# Patient Record
Sex: Female | Born: 1965 | Race: White | Hispanic: No | Marital: Single | Smoking: Former smoker
Health system: Southern US, Community
[De-identification: ages and names within clinical notes are randomized; demographics above are authoritative.]

## PROBLEM LIST (undated history)

## (undated) ENCOUNTER — Emergency Department (HOSPITAL_COMMUNITY): Admission: EM | Payer: Medicaid Other | Source: Home / Self Care

## (undated) DIAGNOSIS — F32A Depression, unspecified: Secondary | ICD-10-CM

## (undated) DIAGNOSIS — F329 Major depressive disorder, single episode, unspecified: Secondary | ICD-10-CM

## (undated) DIAGNOSIS — K760 Fatty (change of) liver, not elsewhere classified: Secondary | ICD-10-CM

## (undated) DIAGNOSIS — F250 Schizoaffective disorder, bipolar type: Secondary | ICD-10-CM

## (undated) DIAGNOSIS — R569 Unspecified convulsions: Secondary | ICD-10-CM

## (undated) HISTORY — DX: Unspecified convulsions: R56.9

## (undated) HISTORY — DX: Fatty (change of) liver, not elsewhere classified: K76.0

## (undated) HISTORY — PX: TUBAL LIGATION: SHX77

## (undated) HISTORY — DX: Major depressive disorder, single episode, unspecified: F32.9

## (undated) HISTORY — DX: Depression, unspecified: F32.A

---

## 1998-04-28 ENCOUNTER — Inpatient Hospital Stay (HOSPITAL_COMMUNITY): Admission: AD | Admit: 1998-04-28 | Discharge: 1998-05-04 | Payer: Self-pay | Admitting: Psychiatry

## 2000-07-16 ENCOUNTER — Inpatient Hospital Stay (HOSPITAL_COMMUNITY): Admission: EM | Admit: 2000-07-16 | Discharge: 2000-07-19 | Payer: Self-pay | Admitting: *Deleted

## 2001-08-16 ENCOUNTER — Other Ambulatory Visit: Admission: RE | Admit: 2001-08-16 | Discharge: 2001-08-16 | Payer: Self-pay | Admitting: Obstetrics and Gynecology

## 2002-08-28 ENCOUNTER — Encounter: Payer: Self-pay | Admitting: Obstetrics and Gynecology

## 2002-08-28 ENCOUNTER — Ambulatory Visit (HOSPITAL_COMMUNITY): Admission: RE | Admit: 2002-08-28 | Discharge: 2002-08-28 | Payer: Self-pay | Admitting: Obstetrics and Gynecology

## 2004-05-30 ENCOUNTER — Emergency Department (HOSPITAL_COMMUNITY): Admission: EM | Admit: 2004-05-30 | Discharge: 2004-05-30 | Payer: Self-pay | Admitting: Emergency Medicine

## 2004-08-06 ENCOUNTER — Emergency Department (HOSPITAL_COMMUNITY): Admission: EM | Admit: 2004-08-06 | Discharge: 2004-08-06 | Payer: Self-pay | Admitting: *Deleted

## 2004-08-12 ENCOUNTER — Emergency Department (HOSPITAL_COMMUNITY): Admission: EM | Admit: 2004-08-12 | Discharge: 2004-08-12 | Payer: Self-pay | Admitting: Emergency Medicine

## 2004-09-06 ENCOUNTER — Emergency Department (HOSPITAL_COMMUNITY): Admission: EM | Admit: 2004-09-06 | Discharge: 2004-09-06 | Payer: Self-pay | Admitting: *Deleted

## 2004-09-09 ENCOUNTER — Emergency Department (HOSPITAL_COMMUNITY): Admission: EM | Admit: 2004-09-09 | Discharge: 2004-09-09 | Payer: Self-pay | Admitting: Emergency Medicine

## 2004-10-05 ENCOUNTER — Emergency Department (HOSPITAL_COMMUNITY): Admission: EM | Admit: 2004-10-05 | Discharge: 2004-10-05 | Payer: Self-pay | Admitting: Emergency Medicine

## 2005-02-22 ENCOUNTER — Emergency Department (HOSPITAL_COMMUNITY): Admission: EM | Admit: 2005-02-22 | Discharge: 2005-02-23 | Payer: Self-pay | Admitting: Emergency Medicine

## 2005-03-21 ENCOUNTER — Emergency Department (HOSPITAL_COMMUNITY): Admission: EM | Admit: 2005-03-21 | Discharge: 2005-03-21 | Payer: Self-pay | Admitting: Emergency Medicine

## 2005-03-28 ENCOUNTER — Emergency Department (HOSPITAL_COMMUNITY): Admission: EM | Admit: 2005-03-28 | Discharge: 2005-03-29 | Payer: Self-pay | Admitting: Emergency Medicine

## 2005-04-06 ENCOUNTER — Ambulatory Visit (HOSPITAL_COMMUNITY): Admission: RE | Admit: 2005-04-06 | Discharge: 2005-04-06 | Payer: Self-pay | Admitting: General Surgery

## 2005-04-06 ENCOUNTER — Encounter (INDEPENDENT_AMBULATORY_CARE_PROVIDER_SITE_OTHER): Payer: Self-pay | Admitting: General Surgery

## 2005-05-10 ENCOUNTER — Emergency Department (HOSPITAL_COMMUNITY): Admission: EM | Admit: 2005-05-10 | Discharge: 2005-05-10 | Payer: Self-pay | Admitting: Emergency Medicine

## 2005-05-13 ENCOUNTER — Emergency Department (HOSPITAL_COMMUNITY): Admission: EM | Admit: 2005-05-13 | Discharge: 2005-05-13 | Payer: Self-pay | Admitting: Emergency Medicine

## 2005-07-27 ENCOUNTER — Emergency Department (HOSPITAL_COMMUNITY): Admission: EM | Admit: 2005-07-27 | Discharge: 2005-07-27 | Payer: Self-pay | Admitting: Emergency Medicine

## 2005-08-02 ENCOUNTER — Ambulatory Visit (HOSPITAL_COMMUNITY): Admission: RE | Admit: 2005-08-02 | Discharge: 2005-08-02 | Payer: Self-pay | Admitting: Emergency Medicine

## 2005-08-02 ENCOUNTER — Emergency Department (HOSPITAL_COMMUNITY): Admission: EM | Admit: 2005-08-02 | Discharge: 2005-08-02 | Payer: Self-pay | Admitting: Emergency Medicine

## 2005-09-29 ENCOUNTER — Emergency Department (HOSPITAL_COMMUNITY): Admission: EM | Admit: 2005-09-29 | Discharge: 2005-09-29 | Payer: Self-pay | Admitting: Emergency Medicine

## 2005-09-29 ENCOUNTER — Ambulatory Visit: Payer: Self-pay | Admitting: Internal Medicine

## 2005-09-29 ENCOUNTER — Ambulatory Visit (HOSPITAL_COMMUNITY): Admission: RE | Admit: 2005-09-29 | Discharge: 2005-09-29 | Payer: Self-pay | Admitting: Internal Medicine

## 2005-10-06 ENCOUNTER — Ambulatory Visit (HOSPITAL_COMMUNITY): Admission: RE | Admit: 2005-10-06 | Discharge: 2005-10-06 | Payer: Self-pay | Admitting: Internal Medicine

## 2005-10-06 ENCOUNTER — Ambulatory Visit: Payer: Self-pay | Admitting: Internal Medicine

## 2005-10-06 ENCOUNTER — Encounter (INDEPENDENT_AMBULATORY_CARE_PROVIDER_SITE_OTHER): Payer: Self-pay | Admitting: Specialist

## 2005-10-13 ENCOUNTER — Ambulatory Visit: Payer: Self-pay | Admitting: Internal Medicine

## 2005-10-13 ENCOUNTER — Emergency Department (HOSPITAL_COMMUNITY): Admission: EM | Admit: 2005-10-13 | Discharge: 2005-10-13 | Payer: Self-pay | Admitting: Emergency Medicine

## 2005-10-18 ENCOUNTER — Emergency Department (HOSPITAL_COMMUNITY): Admission: EM | Admit: 2005-10-18 | Discharge: 2005-10-18 | Payer: Self-pay | Admitting: Emergency Medicine

## 2005-10-20 ENCOUNTER — Emergency Department (HOSPITAL_COMMUNITY): Admission: EM | Admit: 2005-10-20 | Discharge: 2005-10-20 | Payer: Self-pay | Admitting: Emergency Medicine

## 2005-11-16 ENCOUNTER — Emergency Department (HOSPITAL_COMMUNITY): Admission: EM | Admit: 2005-11-16 | Discharge: 2005-11-17 | Payer: Self-pay | Admitting: Emergency Medicine

## 2005-11-20 ENCOUNTER — Emergency Department (HOSPITAL_COMMUNITY): Admission: EM | Admit: 2005-11-20 | Discharge: 2005-11-21 | Payer: Self-pay | Admitting: Emergency Medicine

## 2005-11-25 ENCOUNTER — Ambulatory Visit: Payer: Self-pay | Admitting: Internal Medicine

## 2005-12-06 ENCOUNTER — Emergency Department (HOSPITAL_COMMUNITY): Admission: EM | Admit: 2005-12-06 | Discharge: 2005-12-06 | Payer: Self-pay | Admitting: Emergency Medicine

## 2005-12-08 ENCOUNTER — Emergency Department (HOSPITAL_COMMUNITY): Admission: EM | Admit: 2005-12-08 | Discharge: 2005-12-08 | Payer: Self-pay | Admitting: Emergency Medicine

## 2005-12-13 ENCOUNTER — Observation Stay (HOSPITAL_COMMUNITY): Admission: EM | Admit: 2005-12-13 | Discharge: 2005-12-14 | Payer: Self-pay | Admitting: Emergency Medicine

## 2005-12-13 ENCOUNTER — Emergency Department (HOSPITAL_COMMUNITY): Admission: EM | Admit: 2005-12-13 | Discharge: 2005-12-13 | Payer: Self-pay | Admitting: Emergency Medicine

## 2005-12-18 ENCOUNTER — Emergency Department (HOSPITAL_COMMUNITY): Admission: EM | Admit: 2005-12-18 | Discharge: 2005-12-18 | Payer: Self-pay | Admitting: Emergency Medicine

## 2005-12-31 ENCOUNTER — Emergency Department (HOSPITAL_COMMUNITY): Admission: EM | Admit: 2005-12-31 | Discharge: 2006-01-01 | Payer: Self-pay | Admitting: Emergency Medicine

## 2006-01-10 ENCOUNTER — Emergency Department (HOSPITAL_COMMUNITY): Admission: EM | Admit: 2006-01-10 | Discharge: 2006-01-10 | Payer: Self-pay | Admitting: Emergency Medicine

## 2006-01-12 ENCOUNTER — Emergency Department (HOSPITAL_COMMUNITY): Admission: EM | Admit: 2006-01-12 | Discharge: 2006-01-12 | Payer: Self-pay | Admitting: Emergency Medicine

## 2006-01-13 ENCOUNTER — Inpatient Hospital Stay: Payer: Self-pay | Admitting: Psychiatry

## 2006-02-08 ENCOUNTER — Emergency Department (HOSPITAL_COMMUNITY): Admission: EM | Admit: 2006-02-08 | Discharge: 2006-02-08 | Payer: Self-pay | Admitting: Emergency Medicine

## 2006-09-22 ENCOUNTER — Ambulatory Visit: Payer: Self-pay | Admitting: Internal Medicine

## 2006-10-05 ENCOUNTER — Ambulatory Visit: Payer: Self-pay | Admitting: Internal Medicine

## 2007-03-19 ENCOUNTER — Other Ambulatory Visit: Admission: RE | Admit: 2007-03-19 | Discharge: 2007-03-19 | Payer: Self-pay | Admitting: Obstetrics and Gynecology

## 2007-03-23 ENCOUNTER — Ambulatory Visit: Payer: Self-pay | Admitting: Gastroenterology

## 2007-03-26 ENCOUNTER — Ambulatory Visit (HOSPITAL_COMMUNITY): Admission: RE | Admit: 2007-03-26 | Discharge: 2007-03-26 | Payer: Self-pay | Admitting: Obstetrics and Gynecology

## 2007-06-07 ENCOUNTER — Emergency Department (HOSPITAL_COMMUNITY): Admission: EM | Admit: 2007-06-07 | Discharge: 2007-06-07 | Payer: Self-pay | Admitting: Emergency Medicine

## 2007-06-07 ENCOUNTER — Ambulatory Visit (HOSPITAL_COMMUNITY): Admission: RE | Admit: 2007-06-07 | Discharge: 2007-06-07 | Payer: Self-pay | Admitting: Gastroenterology

## 2007-07-16 ENCOUNTER — Ambulatory Visit: Payer: Self-pay | Admitting: Internal Medicine

## 2007-08-24 IMAGING — CR DG ANKLE COMPLETE 3+V*R*
3 series · 3 of 3 positions shown · non-contrast
Comparison: none

CLINICAL DATA: Patient slipped and fell leaving hospital with pain and swelling. 
 RIGHT ANKLE - 3 VIEW:

[view not recorded (1 of 3)]
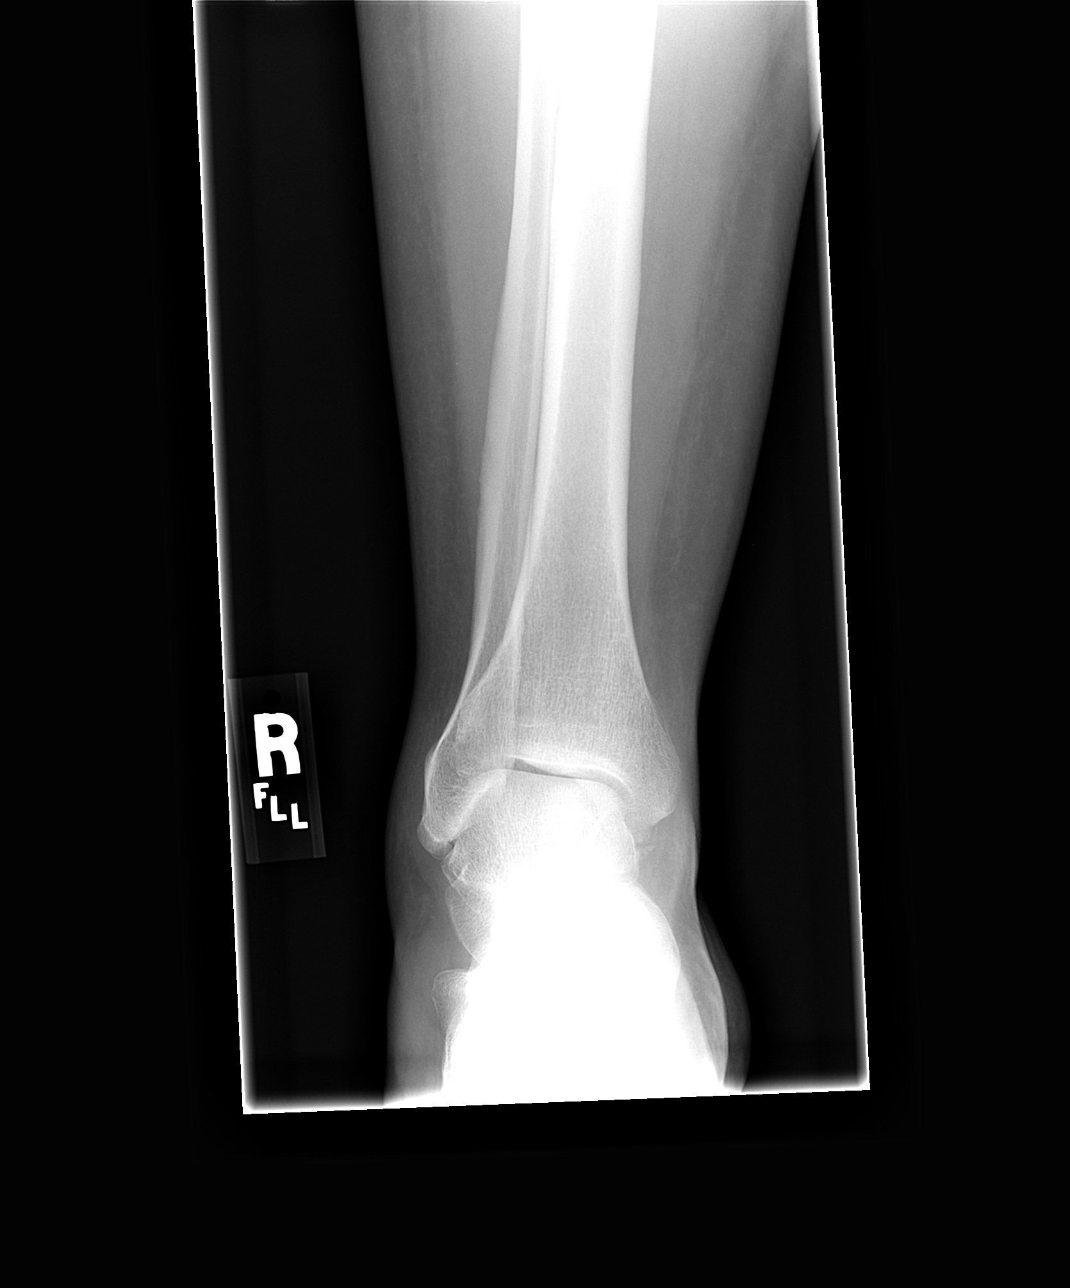

[view not recorded (2 of 3)]
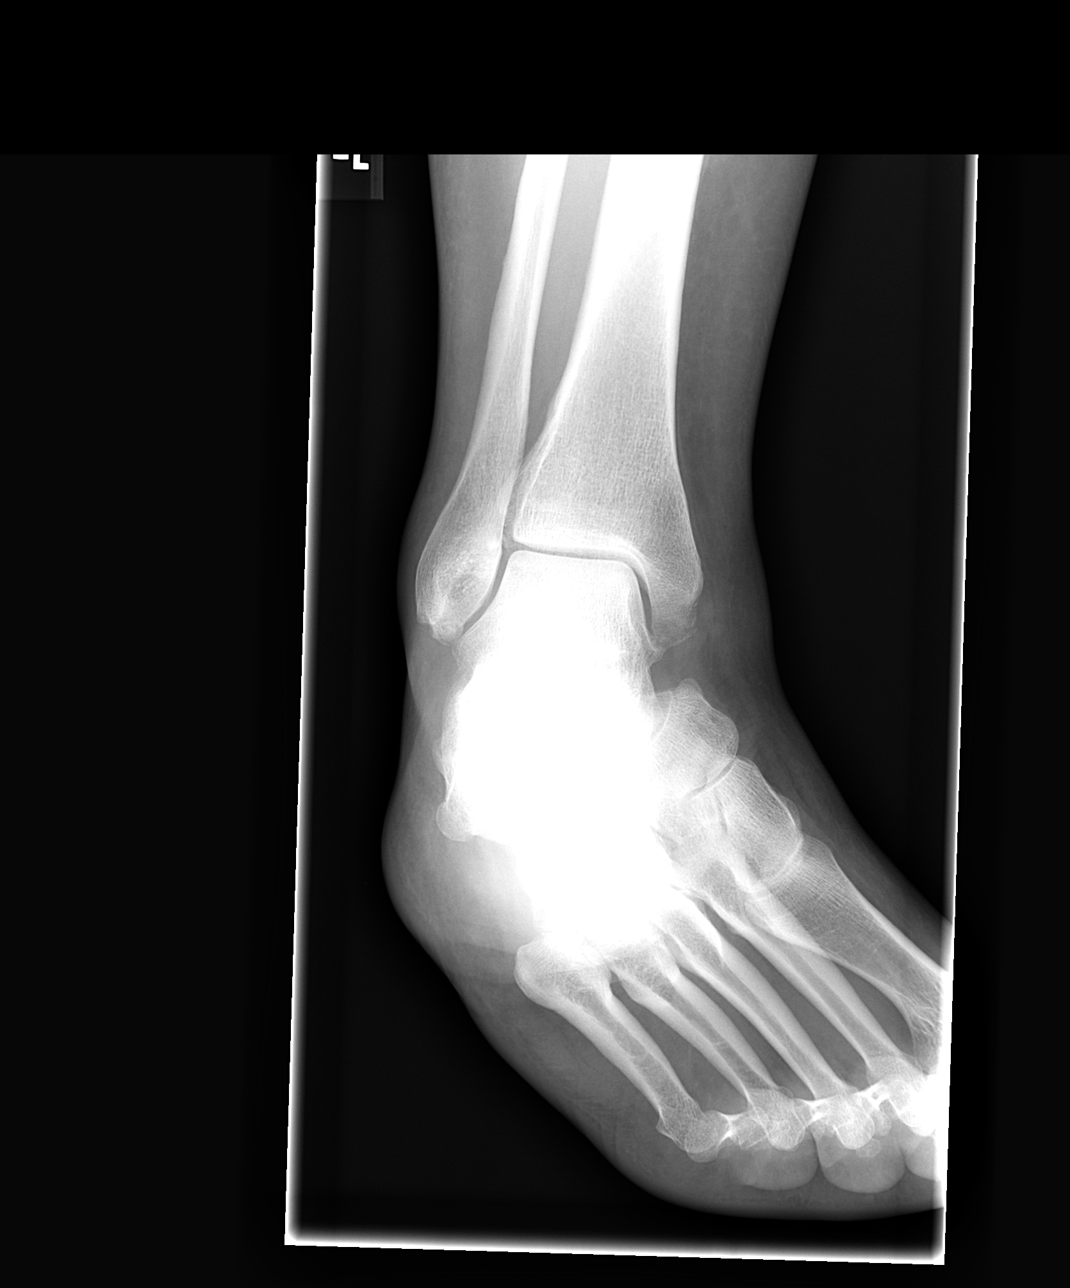

[view not recorded (3 of 3)]
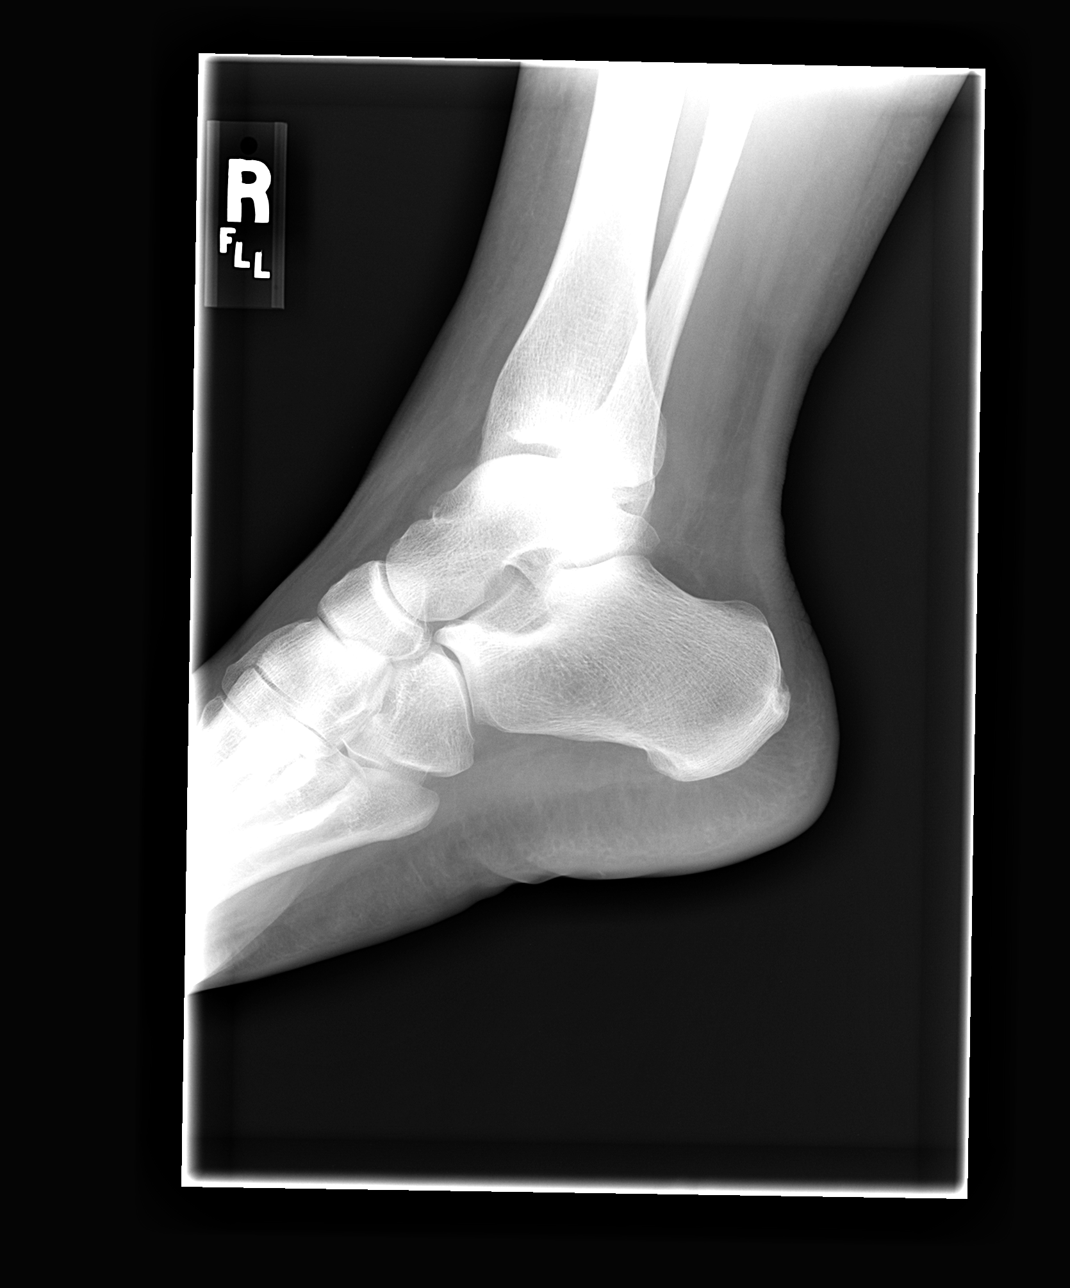

[3 of 3 positions shown; findings below may reference images not displayed]

FINDINGS: Three views of the right ankle were obtained.  There are small avulsion fragments from the tip of the medial malleolus with adjacent soft tissue swelling.  No other acute bony abnormality is seen.
IMPRESSION: Small avulsion fragments from tip of medial malleolus. 
 RIGHT FOOT - 3 VIEW:
 Three views of the right foot show no acute abnormality.  Alignment is normal.
IMPRESSION: Negative right foot.

## 2007-12-05 ENCOUNTER — Encounter: Payer: Self-pay | Admitting: Orthopedic Surgery

## 2007-12-05 ENCOUNTER — Ambulatory Visit (HOSPITAL_COMMUNITY): Admission: RE | Admit: 2007-12-05 | Discharge: 2007-12-05 | Payer: Self-pay | Admitting: Internal Medicine

## 2007-12-06 ENCOUNTER — Ambulatory Visit: Payer: Self-pay | Admitting: Internal Medicine

## 2007-12-12 ENCOUNTER — Telehealth: Payer: Self-pay | Admitting: Orthopedic Surgery

## 2007-12-12 ENCOUNTER — Ambulatory Visit: Payer: Self-pay | Admitting: Orthopedic Surgery

## 2007-12-12 DIAGNOSIS — M25529 Pain in unspecified elbow: Secondary | ICD-10-CM | POA: Insufficient documentation

## 2007-12-12 DIAGNOSIS — M7710 Lateral epicondylitis, unspecified elbow: Secondary | ICD-10-CM | POA: Insufficient documentation

## 2007-12-12 DIAGNOSIS — G5600 Carpal tunnel syndrome, unspecified upper limb: Secondary | ICD-10-CM | POA: Insufficient documentation

## 2007-12-14 ENCOUNTER — Encounter: Payer: Self-pay | Admitting: Orthopedic Surgery

## 2007-12-19 ENCOUNTER — Encounter: Payer: Self-pay | Admitting: Orthopedic Surgery

## 2008-02-11 ENCOUNTER — Ambulatory Visit: Payer: Self-pay | Admitting: Orthopedic Surgery

## 2008-03-10 ENCOUNTER — Ambulatory Visit: Payer: Self-pay | Admitting: Orthopedic Surgery

## 2008-03-10 DIAGNOSIS — M79609 Pain in unspecified limb: Secondary | ICD-10-CM | POA: Insufficient documentation

## 2008-10-31 ENCOUNTER — Other Ambulatory Visit: Admission: RE | Admit: 2008-10-31 | Discharge: 2008-10-31 | Payer: Self-pay | Admitting: Obstetrics & Gynecology

## 2009-02-23 ENCOUNTER — Encounter: Payer: Self-pay | Admitting: Gastroenterology

## 2009-04-08 ENCOUNTER — Encounter: Payer: Self-pay | Admitting: Gastroenterology

## 2009-06-22 ENCOUNTER — Encounter: Payer: Self-pay | Admitting: Urgent Care

## 2009-07-09 ENCOUNTER — Emergency Department (HOSPITAL_COMMUNITY): Admission: EM | Admit: 2009-07-09 | Discharge: 2009-07-09 | Payer: Self-pay | Admitting: Emergency Medicine

## 2009-07-11 ENCOUNTER — Inpatient Hospital Stay: Payer: Self-pay | Admitting: Psychiatry

## 2009-11-18 ENCOUNTER — Encounter: Payer: Self-pay | Admitting: Urgent Care

## 2009-12-21 ENCOUNTER — Encounter: Payer: Self-pay | Admitting: Urgent Care

## 2009-12-22 ENCOUNTER — Encounter: Payer: Self-pay | Admitting: Urgent Care

## 2010-01-14 ENCOUNTER — Other Ambulatory Visit: Admission: RE | Admit: 2010-01-14 | Discharge: 2010-01-14 | Payer: Self-pay | Admitting: Obstetrics & Gynecology

## 2010-01-18 ENCOUNTER — Ambulatory Visit (HOSPITAL_COMMUNITY): Admission: RE | Admit: 2010-01-18 | Discharge: 2010-01-18 | Payer: Self-pay | Admitting: Obstetrics & Gynecology

## 2010-06-29 NOTE — Medication Information (Signed)
Summary: Tax adviser   Imported By: Rosine Beat 12/22/2009 13:38:55  _____________________________________________________________________  External Attachment:    Type:   Image     Comment:   External Document  Appended Document: RX Folder Please call pt, needs OV or can get from PCP  Appended Document: RX Folder LMOM to call.  Appended Document: RX Folder Pt's sister/guardian, [name], informed. said she will check with PCP Dr. [name] and  see if they can get it there.

## 2010-06-29 NOTE — Medication Information (Signed)
Summary: Tax adviser   Imported By: Diana Eves 06/22/2009 11:12:21  _____________________________________________________________________  External Attachment:    Type:   Image     Comment:   External Document  Appended Document: RX Folder    Prescriptions: LANSOPRAZOLE 30 MG CPDR (LANSOPRAZOLE) one by mouth 30 mins before breakfast  #30 x 5   Entered and Authorized by:   Joselyn Arrow FNP-BC   Signed by:   Joselyn Arrow FNP-BC on 06/22/2009   Method used:   Electronically to        [facility] (retail)       [address]       [place], [place]  [address]       Ph: [phone]       Fax: [phone]   RxID:   [id number]  NEEDS OV PRIOR TO FURTHER RFs.    Appended Document: RX Folder lmom to call back

## 2010-06-29 NOTE — Medication Information (Signed)
Summary: Tax adviser   Imported By: Rosine Beat 11/18/2009 16:05:40  _____________________________________________________________________  External Attachment:    Type:   Image     Comment:   External Document

## 2010-06-29 NOTE — Medication Information (Signed)
Summary: Tax adviser   Imported By: Diana Eves 12/21/2009 09:51:32  _____________________________________________________________________  External Attachment:    Type:   Image     Comment:   External Document  Appended Document: RX Folder Needs OV  Appended Document: RX Folder LMOM to call.

## 2010-08-18 LAB — BASIC METABOLIC PANEL
BUN: 8 mg/dL (ref 6–23)
Chloride: 102 mEq/L (ref 96–112)
Creatinine, Ser: 0.77 mg/dL (ref 0.4–1.2)
Glucose, Bld: 93 mg/dL (ref 70–99)

## 2010-08-18 LAB — CBC
MCV: 87.7 fL (ref 78.0–100.0)
Platelets: 214 10*3/uL (ref 150–400)
RDW: 13.2 % (ref 11.5–15.5)
WBC: 8.1 10*3/uL (ref 4.0–10.5)

## 2010-08-18 LAB — URINALYSIS, ROUTINE W REFLEX MICROSCOPIC
Bilirubin Urine: NEGATIVE
Glucose, UA: NEGATIVE mg/dL
Hgb urine dipstick: NEGATIVE
Ketones, ur: NEGATIVE mg/dL
Nitrite: NEGATIVE
Protein, ur: NEGATIVE mg/dL
Specific Gravity, Urine: 1.01 (ref 1.005–1.030)
Urobilinogen, UA: 0.2 mg/dL (ref 0.0–1.0)
pH: 6 (ref 5.0–8.0)

## 2010-08-18 LAB — VALPROIC ACID LEVEL: Valproic Acid Lvl: 35 ug/mL — ABNORMAL LOW (ref 50.0–100.0)

## 2010-08-18 LAB — DIFFERENTIAL
Basophils Absolute: 0 10*3/uL (ref 0.0–0.1)
Basophils Relative: 0 % (ref 0–1)
Eosinophils Absolute: 0.1 10*3/uL (ref 0.0–0.7)
Lymphs Abs: 2.4 10*3/uL (ref 0.7–4.0)
Neutrophils Relative %: 57 % (ref 43–77)

## 2010-08-18 LAB — RAPID URINE DRUG SCREEN, HOSP PERFORMED
Amphetamines: NOT DETECTED
Barbiturates: NOT DETECTED
Benzodiazepines: NOT DETECTED
Cocaine: NOT DETECTED
Opiates: NOT DETECTED
Tetrahydrocannabinol: NOT DETECTED

## 2010-08-18 LAB — ETHANOL: Alcohol, Ethyl (B): <5 mg/dL (ref 0–10)

## 2010-08-18 LAB — PREGNANCY, URINE: Preg Test, Ur: NEGATIVE

## 2010-10-12 NOTE — Assessment & Plan Note (Signed)
NAMEJACQUETTE, Moreno              CHART#:  [id number]   DATE:  10/05/2006                       DOB:  10/11/1965   CHIEF COMPLAINT:  Two week followup, abdominal pain, chest pain.   SUBJECTIVE:  Ms. Chancellor is a 45 year old Caucasian female.  She was  seen by Tana Coast, P.A.C. on September 22, 2006.  At that time she was  having symptoms of gastroesophageal reflux disease as well as atypical  chest pain, which occurred postprandially and nocturnally.  Her  omeprazole 20 mg b.i.d. was discontinued, and she has started Nexium 40  mg b.i.d.  She says that her symptoms have completely resolved.  Her  only concern today is that she is having too-frequent bowel movements.  She is having loose to semi-formed stools 5-6 times a day.  She is on  Amitiza 24 mcg b.i.d., however, she did not notice as she has her  medicines divided out through the pharmacy.  She denies any rectal  bleeding or melena.  Her weight is down 4 pounds since last office  visit.   CURRENT MEDICATIONS:  See the list from Oct 02, 2006.   ALLERGIES:  No known drug allergies.   OBJECTIVE:  VITAL SIGNS:  Weight 240 pounds, height 65 inches,  temperature 97.9, blood pressure 110/68 and pulse 62.  GENERAL:  Ms. Hamidi is a Caucasian female who is alert, oriented,  pleasant, and cooperative in no acute distress.  HEENT:  Sclerae clear.  Anicteric conjunctivae.  Oropharynx pink and  moist without any lesions.  HEART:  Regular rate and rhythm, normal S1, S2, without murmurs, clicks,  rubs, or gallops.  ABDOMEN:  Positive bowel sounds x4, no bruits auscultated, soft,  nontender, nondistended, without palpable masses, hepatosplenomegaly.  No rebound, tenderness, or guarding.  Exam is limited given patient body  habitus.  EXTREMITIES:  Without clubbing or edema bilaterally.  SKIN:  Pink, warm, and dry without any rash or jaundice.   ASSESSMENT:  Ms. Garbett is a 45 year old Caucasian female with  recent  abdominal/chest pain that appears to be related to  gastroesophageal reflux disease, as she has responded nicely to Nexium  b.i.d.  She is mildly mentally challenged and she has her prescriptions  filled by the pharmacy in packets.  Inadvertently, she is taking Amitiza  b.i.d. which may be contributing to her diarrhea, and I have asked her  to withhold this medicine, at least until she becomes constipated again.   PLAN:  1. Continue Nexium 40 mg b.i.d., and I have given her a prescription      for 60 with 5 refills.  2. She is asked to be sure she is taking out the omeprazole twice a      day and the Amitiza twice a day out of her packets from the      pharmacy.  She does verbalize understanding of this      today.  3. She is going to need an office visit in 1 year or sooner if she has      any further problems.       Nicholas Lose, N.P.  Electronically Signed     R. Roetta Sessions, M.D.  Electronically Signed    KC/MEDQ  D:  10/05/2006  T:  10/05/2006  Job:  [id number]   cc:   Dr. Clelia Croft

## 2010-10-12 NOTE — Assessment & Plan Note (Signed)
[name], Moreno              CHART#:  [id number]   DATE:  03/23/2007                       DOB:  10-Dec-1965   PRIMARY GASSTROENTEROLOGIST:  Dr. Jonathon Bellows   CHIEF COMPLAINT:  1. Blood in the stools.  2. Abdominal pain.   SUBJECTIVE:  The patient is here for a followup visit.  She called and  made an appointment because of abdominal pain and rectal bleeding.  We  last saw her in May of 2008.  She has a history of GERD and chronic  intermittent rectal bleeding.  She also has a history of chronic  constipation.  Due to some diminished mental capabilities, she is  somewhat difficult to manage.  She did not bring any of her medications  today and tells me she is not on Nexium although after we called the  pharmacy they verified that she is receiving Nexium in her medication  packs that she gets monthly.  The patient states that she started having  some rectal bleeding last week.  She noted the blood in the toilet.  It  was bright red.  She states currently she is having a bowel movement  almost daily.  She does have to strain occasionally.  She is stating she  is also having some abdominal pain and last night she was doubled over  with pain in the bed.  She states it generally seems to be worse at  nighttime.  It is in her lower abdomen into the left lower quadrant  region.  She denies any fever.  She has had some heartburn.   CURRENT MEDICATIONS:  1. She is on Amitiza 24 mcg twice a day as needed.  2. Bumex 1 mg daily.  3. Celexa 20 mg daily.  4. Depakote 500 mg in the morning, 750 mg in the evening.  5. She is on Nexium 40 mg b.i.d.  6. Risperdal 1-1/2 mg in the morning, 2 mg in the evening.  7. Invega 6 mg q.h.s.   ALLERGIES:  SHE DOES NOT TAKE ASPIRIN.   PHYSICAL EXAM:  Weight 246, up 6 pounds.  Temp 97.5.  Blood pressure  118/70.  Pulse 72.  GENERAL:  Pleasant, conversant, cooperative, Caucasian female in no  acute distress.  SKIN:  Warm and dry.  No  jaundice.  ABDOMEN:  Positive bowel sounds.  Obese but symmetrical.  She has  tenderness in the lower abdomen and left abdomen more so than anywhere  else but does have some mild diffuse tenderness.  No rebound or  guarding.  No organomegaly or masses appreciated but this is limited due  to the patient's body habitus.  RECTAL:  Reveals external hemorrhoids, nonthrombosed, nonbleeding.  No  masses in the rectal vault.  Brown stool was hemoccult negative.   IMPRESSION:  The patient is a 45 year old lady with recent rectal  bleeding.  She does have a history of internal hemorrhoids and her last  colonoscopy was in May 2007.  She had hyperplastic polyps removed from  her rectum and sigmoid colon and internal hemorrhoids and anal papilla  but otherwise unremarkable.  I suspect we are dealing with benign  anorectal bleeding as before.  The quantity is quite minimal.  She is  not having anal penetration/intercourse anymore, the last time was back  in May of this year.  She  also has some heartburn symptoms.  She is  already on Nexium 40 mg b.i.d.  She does not describe any alarm symptoms  at this time.  With regard to her lower abdominal pain/left-sided  abdominal pain, she states that it is quite severe and she was doubled  over yesterday evening.  Given that it is difficult to really nail down  her symptoms because of her mental deficiencies, we need to go ahead and  pursue further workup.   PLAN:  1. CT of the abdomen and pelvis with IV and oral contrast.  2. CBC, CMET.  3. Analpram HC 2.5% apply anorectally t.i.d. p.r.n. for 2-week supply,      0 refills.       Tana Coast, P.A.  Electronically Signed     [name], M.D.  Electronically Signed    LL/MEDQ  D:  03/23/2007  T:  03/25/2007  Job:  [id number]

## 2010-10-12 NOTE — Assessment & Plan Note (Signed)
[name], [name]              CHART#:  [id number]   DATE:  07/16/2007                       DOB:  [birth year]   CHIEF COMPLAINT:  Followup history of abdominal pain and hematochezia.   SUBJECTIVE:  The patient is a 45 year old Caucasian female.  She has a  history of chronic abdominal pain.  She was lat seen 03/23/2007 with  abdominal pain, left lower quadrant and hematochezia.  She had a CBC,  which was normal.  She had a CMP, which was normal.  She also was  scheduled for a CT of the abdomen and pelvis, which showed mild fatty  infiltration of the liver and no acute findings.  This was done on  06/07/2007.  She has a history of chronic GERD.  Has been taking  omeprazole 20 mg b.i.d.  She rarely has breakthrough symptoms.  She is  complaining of some chest discomfort as well as at least 2 episodes of  palpitations, which lasted for several minutes.  During these episodes,  she also had diaphoresis.  She denies any dizziness or syncope.  Denies  any nausea or vomiting.  Her medications have been changed recently from  Depakote to [facility] [place] within the last couple months.  She denies any  anorexia.  Her weight remains stable.  She denies any further rectal  bleeding or melena.   CURRENT MEDICATIONS:  See the list from 07/16/2007.   ALLERGIES:  No known drug allergies.   OBJECTIVE:  VITAL SIGNS:  Weight 242 pounds, height 64 inches,  temperature 97.8, blood pressure 100/62, pulse 64.  She is a well-developed, well-nourished obese female in no acute  distress.  HEENT:  Pupils equal, round, and reactive to light.  Sclerae clear,  nonicteric.  Conjunctivae pink.  Oropharynx pink and moist without  lesions.  NECK:  Supple without evidence of mass or thyromegaly.  CHEST:  Heart regular rate and rhythm.  Normal S1, S2 without murmurs,  clicks, rubs, or gallops.  ABDOMEN:  Positive bowel sounds x4.  No bruits auscultated.  Soft.  Nontender, nondistended without palpable mass or  hepatosplenomegaly.  No  rebound tenderness or guarding.  Exam limited given the patient's body  habitus.  EXTREMITIES:  Without clubbing or edema.   ASSESSMENT:  1. Fatty liver disease with normal LFTs.  2. Abdominal pain has resolved.  3. New-onset episodes of palpitations.  I would like her to be seen by      PCP.  4. Chronic gastroesophageal reflux disease, well controlled on b.i.d.      omeprazole.  5. Hematochezia has resolved.  Suspect this is due to benign anorectal      source.   PLAN:  1. I have set her up with an appointment with Dr. [name] this week for      her episodes of palpitations and diaphoresis, and further      evaluation.  2. I would like for her to lose 1 to 2 pounds per week, and we      discussed this today.  3. Continue omeprazole 20 mg b.i.d.  4. Office visit in 2 years or sooner if needed.       Lorenza Burton, N.P.  Electronically Signed     R. Roetta Sessions, M.D.  Electronically Signed    KJ/MEDQ  D:  07/17/2007  T:  07/17/2007  Job:  [id number]   cc:   [name], MD

## 2010-10-12 NOTE — Assessment & Plan Note (Signed)
NAMEFRANNY, SELVAGE              CHART#:  [id number]   DATE:  12/06/2007                       DOB:  [birth year]   CHIEF COMPLAINT:  Reflux medication is not helping.   SUBJECTIVE:  The patient is a pleasant 45 year old lady who presents  today for breakthrough reflux symptoms.  She states she really has not  been eating very healthy.  She has been eating a lot of food almost  constantly she says.  She is also eating a lot of fried foods.  Her  weight is up 2 pounds since February 2009.  She states that when she is  living with her grandmother, she eats lots of foods because she cooks  all the time.  She eats healthier when she is living with her sister  here.  She spends about half her time in each place.  She has noted a  lot of breakthrough heartburn.  She has had some regurgitation.  She  also has some chest pain postprandially.  Denies any shortness of breath  or diaphoresis related to this.  She has had no more palpitations.  She  denies any vomiting.  She has also had some right upper quadrant  abdominal pain postprandially.  Her bowel movements are fine.  No melena  or rectal bleeding.   CURRENT MEDICATIONS:  See updated list.   ALLERGIES:  No known drug allergies.   PHYSICAL EXAMINATION:  VITAL SIGNS:  Weight 244, temperature 98.3, blood  pressure 102/70, and pulse 72.  GENERAL:  Pleasant, well-nourished, well-developed, obese Caucasian  female in no acute distress.  She is accompanied by her sister Darl Pikes,  who is her legal guardian.  HEENT:  Sclerae nonicteric.  Oropharyngeal mucosa moist and pink.  SKIN:  Warm and dry.  No jaundice.  CHEST:  Lungs are clear to auscultation.  CARDIAC:  Regular rate and rhythm.  Normal S1 and S2.  No murmurs, rubs,  or gallops.  ABDOMEN:  Positive bowel sounds.  Obese and soft.  She has mild  tenderness in the right upper quadrant region to deep palpation. No  rebound or guarding.  No organomegaly or masses, but limited due to  patient's body habitus.  LOWER EXTREMITIES:  No edema.   IMPRESSION:  The patient is a 45 year old lady with gastroesophageal  reflux disease with breakthrough symptoms, currently on omeprazole 20 mg  b.i.d.  She is eating a high fat, greasy diet.  It is likely that her  symptoms are related to refractory reflux.  I cannot exclude the  possibility of biliary disease and she does complain of some  postprandial right upper quadrant pain, although this is somewhat new  symptom and gallbladder disease may be evolving.  She has a history of  fatty liver with normal LFTs.  Prior hematochezia has resolved.   PLAN:  1. LFTs, amylase, and lipase.  2. Antireflux measures.  3. Switch from omeprazole to Zegerid 40 mg daily.  If not better in 5      days, we will increase to b.i.d. 30 minutes before meals, #30      samples provided.  4. She will call next week with progress report.  We will go over her      lab results at that time.  She may ultimately need to have EGD or  ultrasound of the abdomen.  Her last EGD was back in 2007, and      showed a small hiatal hernia and benign gastric polyps.  Not      mentioned above, she does complain of occasional solid food      dysphagia particularly to steak and beef.  We will see how she does      with swish and PPI therapy, but may also will need to have EGD.       Tana Coast, P.A.  Electronically Signed     Kassie Mends, M.D.  Electronically Signed    LL/MEDQ  D:  12/06/2007  T:  12/07/2007  Job:  [id number]   cc:   [name], MD
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
## 2010-10-15 NOTE — Op Note (Signed)
NAME:  [name], [name]             ACCOUNT NO.:  [id number]   MEDICAL RECORD NO.:  [id number]          PATIENT TYPE:  AMB   LOCATION:  DAY                           FACILITY:  [facility]   PHYSICIAN:  R. Roetta Sessions, M.D. DATE OF BIRTH:  12-02-1965   DATE OF PROCEDURE:  10/06/2005  DATE OF DISCHARGE:                                 OPERATIVE REPORT   PROCEDURE:  Esophagoduodenoscopy with biopsy, followed by colonoscopy and  biopsy.   ENDOSCOPIST:  Jonathon Bellows, M.D.   INDICATIONS FOR PROCEDURE:  This patient is a 45 year old Caucasian female  with generalized abdominal pain in the setting of constipation.  She has had  intermittent hematochezia as well.  I saw her in the office on Sep 29, 2005,  and found her to be impacted.  She received enemas and subsequent colonic  purge for today's colonoscopy.  She tells me that these maneuvers have been  associated with essentially underlying resolution of her abdominal pain.  An  EGD and colonoscopy are now being done to further evaluate her abdominal  pain.  She also has a history of gastroesophageal reflux disease symptoms.  She has some vague esophageal dysphagia as well.  The EGD and colonoscopy  are now being done.  I discussed at length with the patient and the  potential risks, benefits and alternatives reviewed.  Any questions have  been answered.  She agreed and consent documentation placed in the medical  record.   DESCRIPTION OF PROCEDURE:  O2 saturation, blood pressure, pulse and  respirations monitored throughout the entire procedure.  Conscious sedation  with Versed 5 mg IV, Demerol 125 mg IV in divided doses.  The instrument was  the Olympus video chip system.   FINDINGS:  The EGD examination revealed in the tubular esophagus there were  no mucosal abnormalities.  The esophagus was widely patent.  The EG junction  easily traversed.  The stomach and gastric cavity was empty and was able to  __________ well.  With a thorough  examination the gastric mucosa including a  retroflexed view of the proximal stomach and esophagogastric junction,  demonstrated only a couple of 5 mm gastric polyps.  The gastric mucosa  otherwise appeared normal.  The pylorus was patent and easily traversed.  Examination of the bulb and second portion revealed no abnormalities.   THERAPY/DIAGNOSTIC MANEUVERS PERFORMED:  One of the polyps in the stomach  was biopsied/removed.  The patient tolerated the procedure well and was  prepared for a colonoscopy.  A digital rectal examination revealed a fairly  tight anal sphincter.  Endoscopic findings revealed the prep was sub-optimal  with viscus stool-lined colonic mucosa.   RECTUM:  The examination of the rectal mucosa including a retroflexed view  of the anal verge, revealed anal papilla and internal hemorrhoids and a 4 mm  polyp at 10 cm.  The rectal mucosa appeared normal.   COLON:  The colonic mucosa was surveyed from the rectosigmoid junction  through the left, transverse and right colon to the area of the appendiceal  orifice and ileocecal valve and cecum.  These structures were  well-seen and  photographed for the record.  The Olympus scope was slowly withdrawn and all  previously-mentioned mucosal surfaces were again seen.  The patient had a  second 4 mm polyp at 25 cm.  It was cold biopsied/removed.  Despite copious  washings, it was still difficult to see the fine mucosal detail of all the  colon; however, there were no obvious polyp or other lesion seen.  Both the  rectal and sigmoid polyps were cold biopsied/removed.   The patient tolerated both procedures well and was reactive in endoscopy.   IMPRESSION:  1.  ESOPHAGOGASTRODUODENOSCOPY:  Normal esophagus with small hiatal hernia,      gastric polyps noted, benign-appearing.  One biopsy/removed.  The      remainder of the gastric mucosa appeared normal.  Patent pylorus.      Normal D-I, D-II.  2.  COLONOSCOPY FINDINGS:  Anal  papilla and internal hemorrhoids.      Diminutive rectal and sigmoid polyps, both removed.  The remainder of      the colonic mucosa appeared normal.  A sub-optimal prep compromising the      examination.   I suspect that the vast majority of the patient's recent symptoms are indeed  due to constipation/obstipation.   RECOMMENDATIONS:  1.  Continue Protonix 40 mg orally daily for gastroesophageal reflux      disease.  2.  Add MiraLax 70 grams orally at bedtime nightly.  3.  Peri-Colace one tab b.i.d.  4.  A 10-day course of Anusol AC suppositories, one per rectum at bedtime.  5.  Follow up on pathology.  6.  Further recommendations to follow.      Jonathon Bellows, M.D.  Electronically Signed     RMR/MEDQ  D:  10/06/2005  T:  10/07/2005  Job:  [id number]   cc:   [name], MD  Fax: [phone]

## 2010-10-15 NOTE — H&P (Signed)
[name], [name]             ACCOUNT NO.:  [id number]   MEDICAL RECORD NO.:  [id number]          PATIENT TYPE:  OBV   LOCATION:  A303                          FACILITY:  [facility]   PHYSICIAN:  Tilda Burrow, M.D. DATE OF BIRTH:  [birth year]   DATE OF ADMISSION:  12/13/2005  DATE OF DISCHARGE:  LH                                HISTORY & PHYSICAL   OBSERVATION SUMMARY:   OBSERVATION TIME:  16 hours.   ADMITTING DIAGNOSES:  1.  Nonspecific abdominal pain without abnormalities identifiable.  2.  Obesity.  3.  Bipolar disorder.  4.  Amenorrhea.  Suspected anovulation.   DISCHARGE DIAGNOSES:  1.  Nonspecific abdominal pain without abnormalities identifiable.  2.  Obesity.  3.  Bipolar disorder.  4.  Amenorrhea.  Suspected anovulation.   HISTORY OF PRESENT ILLNESS:  This 45 year old female, gravida 0, para 0,  last menstrual period May of 2007, last GYN exam at least 2 years ago, with  long-standing history of bipolar disorder is admitted for overnight  observation due to complaints of abdominal pain resulting in 2 ER admissions  in the last 24 hours.  The patient was seen in the emergency room in the  middle of the night, having already called our office, calling the on-call  service at 2 a.m. requesting consultation to see if she could have a  hysterectomy.  She presented to the emergency room complaining of mid  abdominal and lower abdominal discomfort with minimal lower abdominal  complaints.  She was evaluated extensively by Dr. [name] including CT of  the abdomen which was interpreted as normal, with pelvic ultrasound being  performed which showed a normal uterus at 8.3 x 4.0 x 5.8 cm with normal  endometrial stripe 1.3 cm, consistent with amenorrhea and persistent  anovulatory state, which were within normal limits, and a simple cyst in the  right ovary, possibly representing hemorrhagic cyst, was present.  It was  only 2.1 cm, considered within normal limits.  She  also complained of a  headache without lateralizing symptoms or neurologic losses.   PAST MEDICAL HISTORY:  Positive for bipolar disorder with a prior history of  seizure disorder, not currently active.  She has had no seizures in quite  some time, but stays on medication.   MEDICATIONS:  The medication list reported by the patient:  1.  Depakote 250 mg one daily.  2.  Risperdal 1 mg twice daily.  3.  Protonix 40 mg daily.  4.  Bumetanide 1 mg once daily.  5.  Zelnorm 60 mg once daily.  6.  Tramadol/acetaminophen p.r.n. discomfort.   She reports Dr. [name] of [facility] is her primary care doctor.  She has  been managed by Dr. [name] at [facility] for bipolar  disorder.   SURGICAL HISTORY:  Tubal ligation at age 78, voluntary by patient request.  Allegedly, a boyfriend's family did not want her to have children.  Allegedly normal Pap smear 2 years ago; doctor's name and location of the  Pap unknown.   PHYSICAL EXAMINATION:  GENERAL:  An overweight Caucasian female in  no acute  distress, alert and oriented x3.  Not interested in going home.  VITAL SIGNS:  Temperature 99.4, blood pressure 118/77, pulse 80,  respirations 22.  Pain reported through the night at 9/10 during the night,  improved during the day.  HEENT:  Pupils were equal, round and reactive.  Extraocular movements were  intact.  NECK:  Supple.  CHEST:  Clear to auscultation.  ABDOMEN:  Bowel sounds active.  Exam limited due to patient's obesity.  No  guarding or rebound.  The patient complains of pain above the umbilicus with  only minimal lower abdominal discomfort.  There is no rebound tenderness.  PELVIC:  External genitalia normal female.  Vaginal length is short.  Cervix  anterior, nontender, nonpurulent, with no abnormal discharge to speculum  exam.  Uterus is nontender.  Adnexa without masses or tenderness, consistent  with ultrasound.   HOSPITAL COURSE:  The hospital course consisted of  overnight observation.  She took Bupinex IV a couple times through the night.  Followup exam the  following morning showed nothing of suspicion, and pelvic exam was entirely  negative, so the patient was discharged home.  She was not interested in  going home, wanted some more tests run, but was willing to accept the  normalcy of her evaluation after discussion of the extensive nature of the  work-up in the emergency room.  The patient will accept the idea of going  home with followup with Dr. [name].  She is made aware that if her condition  deteriorates she still has the option to return to the emergency room, or,  preferably, to Dr. [name] office.   DISCHARGE MEDICATIONS:  Continue current medications plus the addition of  Donnatal Extentabs one tablet every 12 hours p.r.n. pain, #20 tablets,  refill x1.      Tilda Burrow, M.D.  Electronically Signed     JVF/MEDQ  D:  12/14/2005  T:  12/14/2005  Job:  [id number]   cc:   [name], MD  Fax: [phone]   Dr. Gret[facility]

## 2010-10-15 NOTE — Consult Note (Signed)
[name], [name]             ACCOUNT NO.:  [id number]   MEDICAL RECORD NO.:  [id number]           PATIENT TYPE:   LOCATION:                                 FACILITY:   PHYSICIAN:  R. Roetta Sessions, M.D. DATE OF BIRTH:  [birth year]   DATE OF CONSULTATION:  09/29/2005  DATE OF DISCHARGE:                                   CONSULTATION   REASON FOR CONSULTATION:  Abdominal pain.   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 [name], of [place],  [place], to further evaluate abdominal pain.  [name], who  attends to clinic today by herself, tells me she has had abdominal pain for  six months.  She describes generalized abdominal pain and chronic  constipation.  She goes a week or more without a bowel movement.  She does  see some bright red blood on occasion with stooling.  There is a vague  postprandial component to her abdominal pain.  She also has reflux symptoms  and describes esophageal dysphagia to solids.  She has not lost any weight.  Apparently she has had no diagnostic studies done.  She failed to bring her  medication list with her at this point in time and is unable to tell me what  she is currently taking.   Her mother died of metastatic cancer, unknown primary.   She does have occasional nausea and vomiting.  She has not had a fever or  chills.  She cannot tell me whether or not she has had CT, ultrasound, etc.   PAST MEDICAL HISTORY:  1.  Depression.  2.  Seizure disorder.  3.  Behavioral problem.  4.  Reported history of  stomach ulcer but no diagnostic studies as far as      patient can recall.  5.  History of gastroesophageal reflux disease.   PAST SURGICAL HISTORY:  1.  Tubal ligation.  2.  Hemorrhoid surgery.   CURRENT MEDICATIONS:  Not currently available.   ALLERGIES:  She denies allergies to any medications.   FAMILY HISTORY:  Mother died of  metastatic cancer, unknown primary.  Father  is alive at age 68 and in relatively good health.   SOCIAL HISTORY:  The patient lives by herself in [place], [place].  Her sister, Barnabas Moreno, is her guardian.  She is single, no children.  She is disabled.  She occasionally smokes cigarettes.  No alcohol, no  illicit drugs.   REVIEW OF SYSTEMS:  No chest pain, no dyspnea, no fever or chills, no change  in weight.   PHYSICAL EXAMINATION:  GENERAL APPEARANCE:  A pleasant 45 year old lady in  no acute distress who is somewhat mentally slow but is pleasant and answers  questions at least to a superficial level.  VITAL SIGNS:  Weight 239, height 5 feet 1 inch, temperature 98.2, blood  pressure 128/68, pulse 72.  SKIN:  Warm and dry.  There is no jaundice .  No stigmata of chronic liver  disease.  HEENT:  No scleral icterus.  Oral cavity:  No lesions.  CHEST:  Lungs are clear to auscultation.  CARDIOVASCULAR:  Regular rate and rhythm without murmurs, rubs, or gallops.  BREASTS:  Examination deferred.  ABDOMEN:  Obese, positive bowel sounds.  She has mild diffuse abdominal  tenderness, no appreciable mass or organomegaly.  EXTREMITIES:  No edema.  RECTAL:  A couple of small hemorrhoid tags. She has very good sphincter tone  and is uncomfortable with digital examination.  She is impacted.  No mass in  rectal vault.  Brown stool.  Hemoccult positive.   IMPRESSION:  [name] is a 45 year old lady with a number of GI  complaints including constipation, hematochezia, generalized abdominal pain,  gastroesophageal reflux disease symptoms and odynophagia to solids.   She is impacted.   Eventually she is going to need an EGD with possible esophageal dilation and  a diagnostic colonoscopy.  For the time being, however, need to get her  disimpacted and will arrange some milk and molasses enemas.  Would like to  go ahead and get some plain films of her abdomen to see where we  stand as  far as stool in the colon, etc.  Will be talking with [name] in  the very near future to start the informed consent process for endoscopic  evaluation.  Will need to get a current accurate list of her medications for  review before pursuing endoscopic evaluation.   I would like to thank Dr. [name] for his kind referral.      R. Roetta Sessions, M.D.  Electronically Signed     RMR/MEDQ  D:  09/29/2005  T:  09/29/2005  Job:  [id number]   cc:   [name], MD  Fax: [phone]

## 2010-10-15 NOTE — H&P (Signed)
[facility]  Patient:    [name], [name]                    MRN: [id number] Adm. Date:  [id number] Attending:  Otilio Saber Dictator:   Young Berry Scott, N.P.                   Psychiatric Admission Assessment  IDENTIFYING INFORMATION:  This is 45 year old white female, voluntary admission after threatening to cut her wrists with broken glass or a knife at the group home where she resides.  They had to chase her around the group home while she was threatening to cut herself.  She apparently made an attempt that resulted in practically undetectable superficial scratches.  REASON FOR ADMISSION AND SYMPTOMS:  Patient is a resident of a group home due to her low I.Q. and inability to function independently, though she is able to provide a somewhat limited history; however, she admits to increasing depression over the past 2 years since her mother died, and having daily thoughts of wanting to die to be with her mother.  She denies any specific current triggering event that occurred yesterday; however, she does report increasing sadness, increasing crying over the past month or so, increasing feelings of hopelessness.  She reports increasing pressure recently from breakup with a boyfriend, and also increasing from an old boyfriend who wants to return to her life.  However, she does state she is not ready for that and does not want a relationship with him at this time.  She also reports that she is unhappy at the group home where she is.  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  She states that she is interested in being moved to another group home, but she seems to be under the impression that if she moves from the group home that she wont need to go to the same job every day.  Patient currently admits to having current suicidal ideation, in terms of  still feeling sad and wanting to be with her mother; however, she denies any plan right now or intent and is able to contract for safety on the unit.  She denies any homicidal ideation, denies any auditory or visual hallucinations.  PAST PSYCHIATRIC HISTORY:  She is currently followed by [facility].  She was last hospitalized at [facility] 2 or 3 months ago for depression, and reports that she was also previously hospitalized in [place] many years ago.  She does admit to previous suicide attempts and is unclear on how many those are.  SUBSTANCE ABUSE HISTORY:  This patient is negative.  PAST MEDICAL HISTORY:  Primary care physician is Dr. Clelia Croft in [place].  Medical problems:  Patient is status post tubal ligation.  She has a history of seizures.  She denies being sexually active at this time.  She is currently being treated for a bacterial vaginosis and has some superficial scratches on her wrist, which are practically undetectable.  Current medications are Celexa 20 mg daily, metronidazole 500 mg x 7 days, Depakote 250 mg 2 q.a.m. and 3 at h.s., Trazodone 100 mg q.h.s., Risperdal 1 mg t.i.d., Bumex 1 mg q.a.m.  Drug allergies:  No known drug allergies.  POSITIVE PHYSICAL FINDINGS:  Please see the PE done at [facility]. Her Depakote level there was 80.  Her UDS and ETOH levels were negative.  It is noted that her skin is intact.  She had no laceration yesterday.  Vital signs from 8 a.m. today, temp was 98.9, pulse 87, respirations 20, blood pressure 156/75, weight is 232 pounds, she is 6 feet 3-1/2 inches tall.  It is noted that she is an obese female in no distress, generally healthy in appearance.  Gait is normal.  She is able to handle her own activities of daily living and has no acute physical complaints today.  SOCIAL HISTORY:  Patient lives in a group home on [address] [place] secondary to limited intellectual  capacity.  Her closest relative she describes as her sister Santina Evans, although she knows her as Barnabas Harries.  Her home phone number is [phone], who works at [employer] (? spelling) [employer]ician, dispensing in [place].  She describes this as her closest relative.  FAMILY HISTORY:  Positive for a father who is an alcoholic.  MENTAL STATUS EXAMINATION:  This is a white female, relaxed and cooperative. She is calm and pleasant.  She is dressed in a hospital gown.  Her affect is flat but she has good eye contact.  Her speech is slow and somewhat spoken in a monotone.  Her mood is sad and depressed.  Her thought process is logical and coherent, although limited to short answers and few words.  She is positive for suicidal ideation but is able to contract for safety, no intent, no plan.  She is negative for auditory or visual hallucinations, negative for homicidal ideation.  Cognitively, she is oriented x 3 and is intact.  ADMISSION DIAGNOSES: Axis I:    Major depression, recurrent, severe. Axis II:   Deferred. Axis III:  History of seizures, and bacterial vaginosis. Axis IV:   Deferred. Axis V:    Current 30, past year 4.  INITIAL PLAN OF CARE:  Plan is to admit this patient to stabilize her mood and thinking.  We will increase her Celexa to 40 mg q.a.m.  We will monitor her q.15 minutes for safety.  Follow up will be arranged through [facility].  We will monitor her depressive symptoms and suicidal ideation and clarify how long she needs to be on the metronidazole.  ESTIMATED LENGTH OF STAY:  Two to five days. DD:  07/17/00 TD:  07/17/00 Job: [id number]

## 2010-10-15 NOTE — Op Note (Signed)
[name], [name]             ACCOUNT NO.:  [id number]   MEDICAL RECORD NO.:  [id number]          PATIENT TYPE:  AMB   LOCATION:  DAY                           FACILITY:  [facility]   PHYSICIAN:  Dalia Heading, M.D.  DATE OF BIRTH:  April 16, 1966   DATE OF PROCEDURE:  04/06/2005  DATE OF DISCHARGE:                                 OPERATIVE REPORT   PREOPERATIVE DIAGNOSIS:  1.  Hemorrhoid disease.  2.  Rectal pain.   POSTOPERATIVE DIAGNOSIS:  1.  Hemorrhoid disease.  2.  Rectal pain.  3.  Anal fissures   PROCEDURE:  Hemorrhoidectomy with fissurectomy.   SURGEON:  Franky Macho, M.D.   ANESTHESIA:  General endotracheal.   INDICATIONS:  The patient is a 45 year old white female with multiple  medical problems including nonspecific seizure disorder and nonspecified  mental deficiency who presents with a two year history of recurrent rectal  pain. History is difficult to obtain from the patient.  The patient comes to  the operating room for hemorrhoidectomy.  Risks and benefits of the  procedure including bleeding, infection, recurrence of hemorrhoidal disease  were fully explained to the patient and her family, gave informed consent  for the procedure.   PROCEDURE NOTE:  The patient was placed in the lithotomy position after  induction of general endotracheal anesthesia.  The perineum was prepped and  draped using the usual sterile technique with Betadine.  Surgical site  confirmation was performed.   On examination, the patient had sentinel tags and hemorrhoidal disease at  the 12 o'clock and 6 o'clock positions.  No significant internal  hemorrhoidal disease was noted.  Fissures were noted in both these regions  in addition.  The internal sphincter mechanism was not constricted.  Under  light anesthesia she was noted to have a tight external sphincter tone.  It  was elected to proceed with a hemorrhoidectomy/sentinel tag excision as well  as fissurectomy at both the 12  o'clock and 6 o'clock positions. The mucosal  edges were reapproximated using 2-0 Vicryl running suture.  An internal  lateral sphincterotomy was not performed given the operative findings.  I  suspect that given her mental status and medications that she is on, I will  try to control or prevent recurrence of the fissures with diet and  medications.  Sensorcaine 0.5% was instilled into the surrounding perineum.  Surgicel and viscous Xylocaine rectal packing was then placed.   All tape and needle counts were correct at the end of the procedure.  The  patient was extubated in the operating room and went back to recovery room  awake in stable condition.   COMPLICATIONS:  None.   SPECIMEN:  Hemorrhoidal tissue.   BLOOD LOSS:  Minimal.      Dalia Heading, M.D.  Electronically Signed     MAJ/MEDQ  D:  04/06/2005  T:  04/06/2005  Job:  [id number]   cc:   Lazaro Arms, M.D.  Fax: [phone]   [name], MD  Fax: [phone]

## 2010-10-15 NOTE — H&P (Signed)
[name], [name]             ACCOUNT NO.:  [id number]   MEDICAL RECORD NO.:  [id number]          PATIENT TYPE:  AMB   LOCATION:                                FACILITY:  [facility]   PHYSICIAN:  [name], M.D.  DATE OF BIRTH:  1966/03/05   DATE OF ADMISSION:  DATE OF DISCHARGE:  LH                                HISTORY & PHYSICAL   CHIEF COMPLAINT:  Hemorrhoidal disease.   HISTORY OF PRESENT ILLNESS:  The patient is a 45 year old white female who  is referred for evaluation and treatment for hemorrhoidal disease.  She says  it has been present for many years, but recently has increased causing her  increased pain and intermittent hemorrhoidal bleeding.   PAST MEDICAL HISTORY:  Includes a nonspecific seizure disorder.   PAST SURGICAL HISTORY:  Unremarkable.   CURRENT MEDICATIONS:  Risperdal, Lasix, Depakote.   ALLERGIES:  No known drug allergies.   REVIEW OF SYSTEMS:  The patient denies drinking or smoking.  She denies any  recent seizure complications.   PHYSICAL EXAMINATION:  GENERAL:  On physical examination, the patient is a  well-developed well-nourished white female in no acute distress.  LUNGS:  Clear to auscultation with equal breath sounds bilaterally.  HEART:  Heart examination reveals a regular rate and rhythm without S3, S4,  or murmurs.  ABDOMEN:  The abdomen is soft, nontender, nondistended.  No  hepatosplenomegaly, masses, or hernia are identified.  RECTAL:  Examination reveals multiple external hemorrhoids present.  No  active bleeding is noted.  Examination was limited secondary to pain.   IMPRESSION:  Hemorrhoidal disease   PLAN:  The patient was scheduled for a hemorrhoidectomy on April 06, 2005.  The risks and benefits of the procedure including bleeding, infection, and  the possibility of recurrence of the hemorrhoids were fully explained to the  patient, who gave informed consent.      [name], M.D.  Electronically Signed     MAJ/MEDQ  D:  03/31/2005  T:  03/31/2005  Job:  [id number]   cc:   [name]  Fax: [phone]   [name], M.D.  Fax: [phone]   Sheila Peri, MD  Fax: [phone]

## 2011-02-16 LAB — URINALYSIS, ROUTINE W REFLEX MICROSCOPIC
Bilirubin Urine: NEGATIVE
Ketones, ur: NEGATIVE
Nitrite: NEGATIVE
Protein, ur: NEGATIVE
Urobilinogen, UA: 0.2
pH: 8

## 2011-07-04 ENCOUNTER — Other Ambulatory Visit: Payer: Self-pay | Admitting: Obstetrics & Gynecology

## 2011-07-04 DIAGNOSIS — Z139 Encounter for screening, unspecified: Secondary | ICD-10-CM

## 2011-07-05 ENCOUNTER — Ambulatory Visit (HOSPITAL_COMMUNITY)
Admission: RE | Admit: 2011-07-05 | Discharge: 2011-07-05 | Disposition: A | Discharge status: Home / Self Care | Payer: Medicaid Other | Source: Ambulatory Visit | Attending: Obstetrics & Gynecology | Admitting: Obstetrics & Gynecology

## 2011-07-05 DIAGNOSIS — Z1231 Encounter for screening mammogram for malignant neoplasm of breast: Secondary | ICD-10-CM | POA: Insufficient documentation

## 2011-07-05 DIAGNOSIS — Z139 Encounter for screening, unspecified: Secondary | ICD-10-CM

## 2011-07-21 ENCOUNTER — Other Ambulatory Visit: Payer: Self-pay | Admitting: Obstetrics & Gynecology

## 2011-07-21 ENCOUNTER — Other Ambulatory Visit (HOSPITAL_COMMUNITY)
Admission: RE | Admit: 2011-07-21 | Discharge: 2011-07-21 | Disposition: A | Discharge status: Home / Self Care | Payer: Medicaid Other | Source: Ambulatory Visit | Attending: Obstetrics & Gynecology | Admitting: Obstetrics & Gynecology

## 2011-07-21 DIAGNOSIS — Z01419 Encounter for gynecological examination (general) (routine) without abnormal findings: Secondary | ICD-10-CM | POA: Insufficient documentation

## 2012-08-01 ENCOUNTER — Other Ambulatory Visit (HOSPITAL_COMMUNITY)
Admission: RE | Admit: 2012-08-01 | Discharge: 2012-08-01 | Disposition: A | Discharge status: Home / Self Care | Payer: Medicaid Other | Source: Ambulatory Visit | Attending: Obstetrics and Gynecology | Admitting: Obstetrics and Gynecology

## 2012-08-01 ENCOUNTER — Other Ambulatory Visit: Payer: Self-pay | Admitting: Adult Health

## 2012-08-01 DIAGNOSIS — Z01419 Encounter for gynecological examination (general) (routine) without abnormal findings: Secondary | ICD-10-CM | POA: Insufficient documentation

## 2012-08-01 DIAGNOSIS — R87810 Cervical high risk human papillomavirus (HPV) DNA test positive: Secondary | ICD-10-CM | POA: Insufficient documentation

## 2012-08-01 DIAGNOSIS — Z1151 Encounter for screening for human papillomavirus (HPV): Secondary | ICD-10-CM | POA: Insufficient documentation

## 2013-05-29 IMAGING — MG MM DIGITAL SCREENING BILAT
4 series · 4 of 4 positions shown · non-contrast
Comparison: none

DG SCREEN MAMMOGRAM BILATERAL
Bilateral CC and MLO view(s) were taken.

DIGITAL SCREENING MAMMOGRAM WITH CAD:
The breast tissue is heterogeneously dense.  No masses or malignant type calcifications are 
identified.  Compared with prior studies.
Images were processed with CAD.

[L CC]
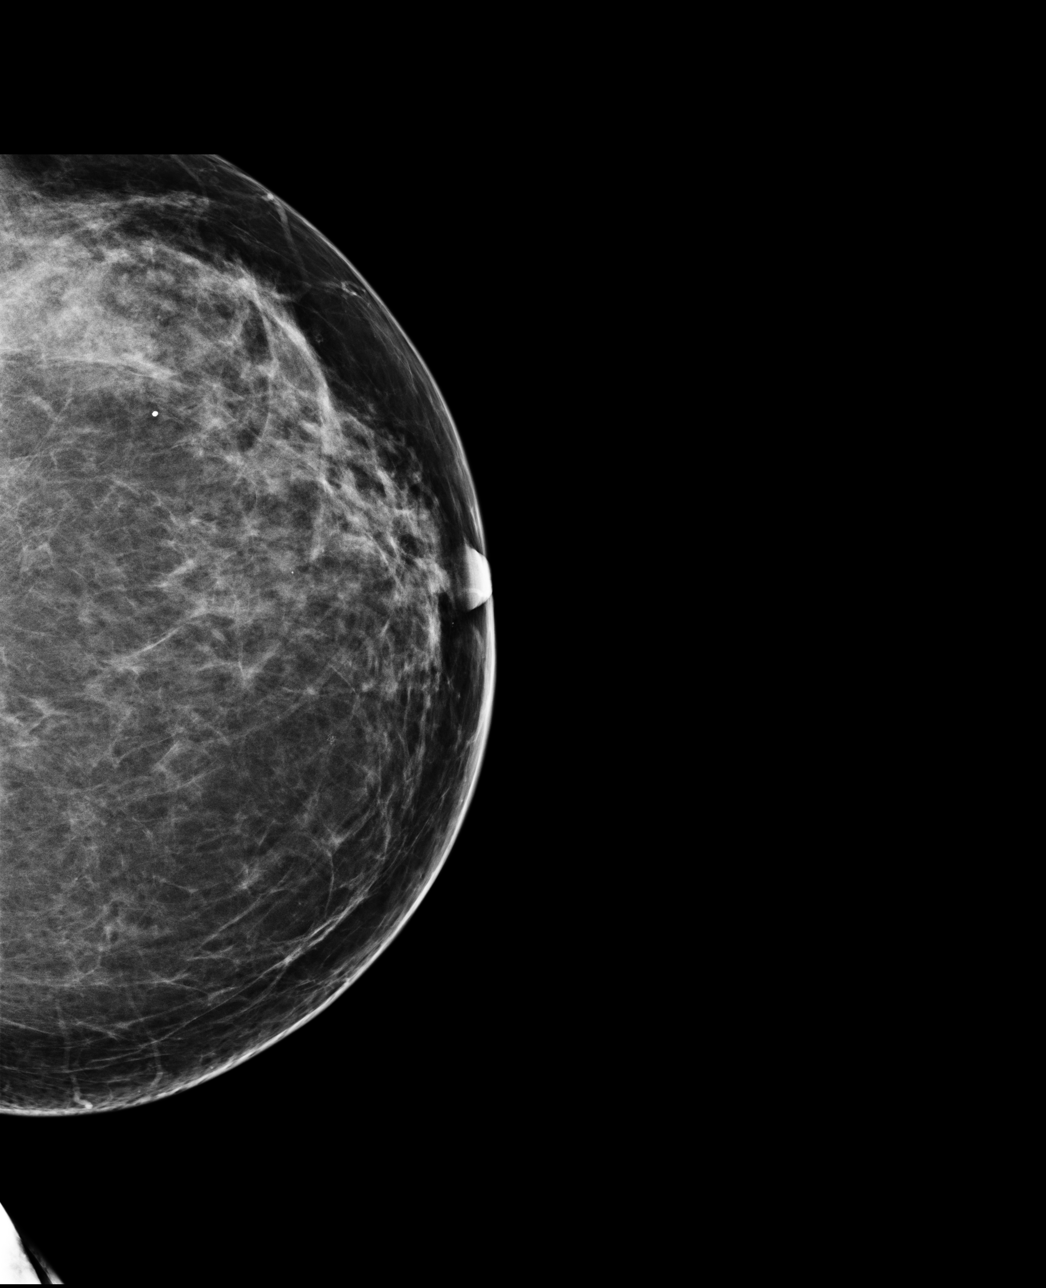

[L MLO]
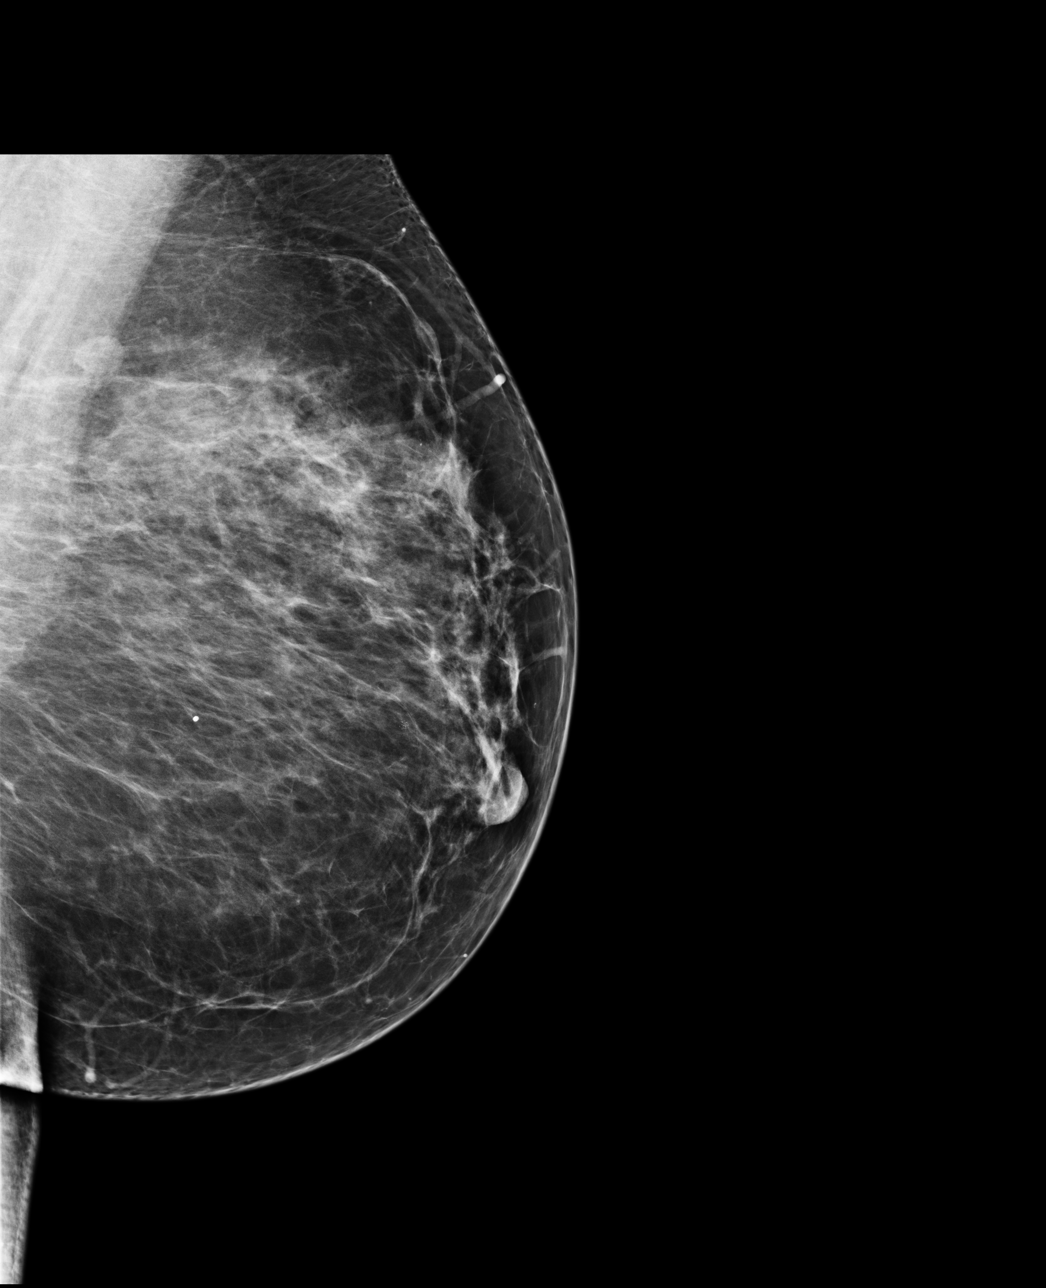

[R CC]
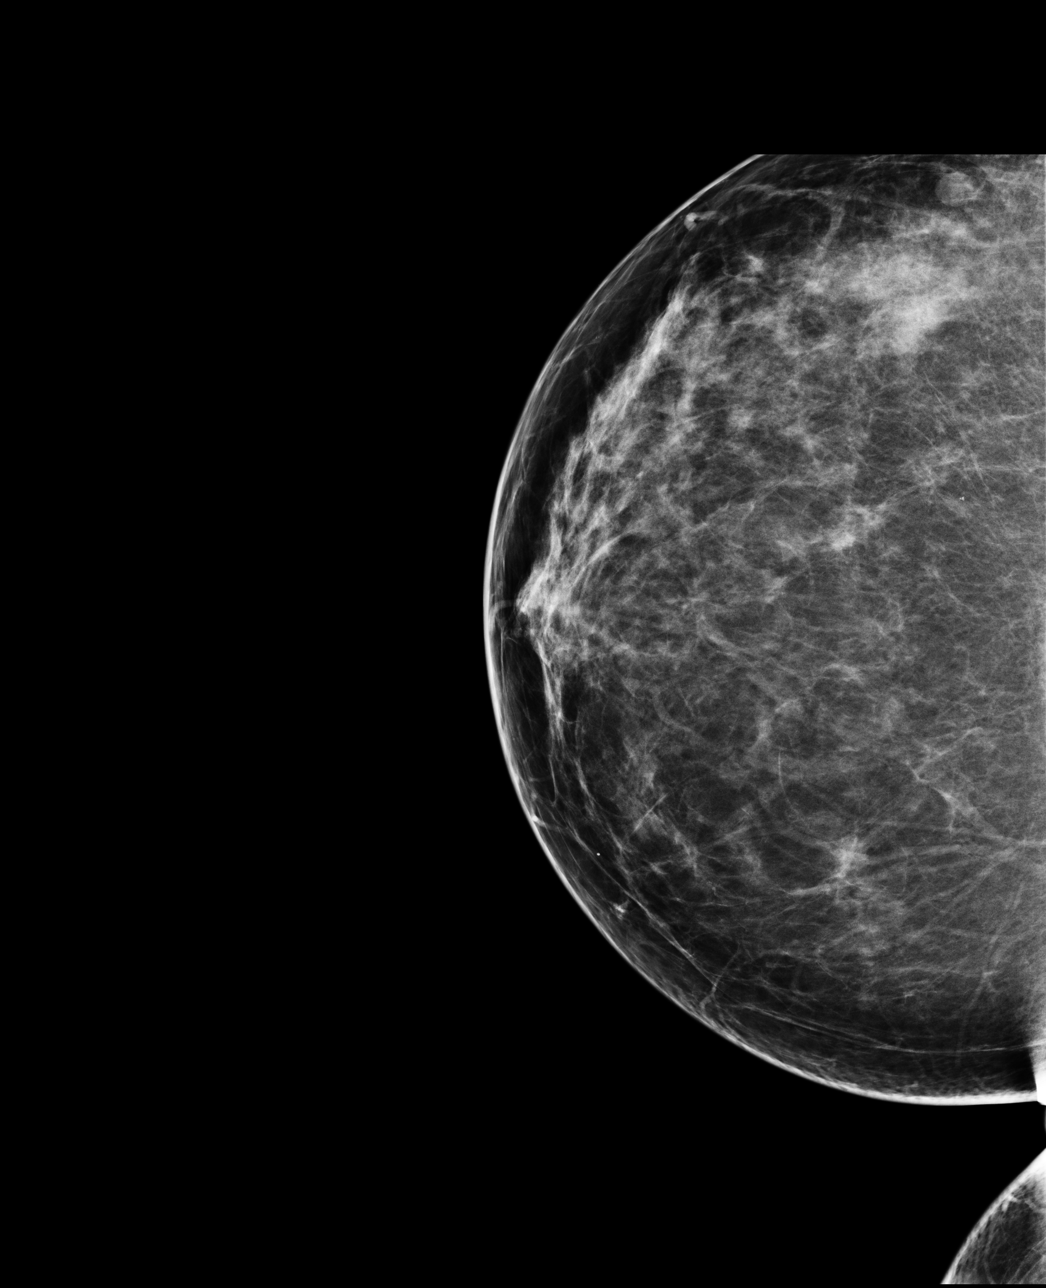

[R MLO]
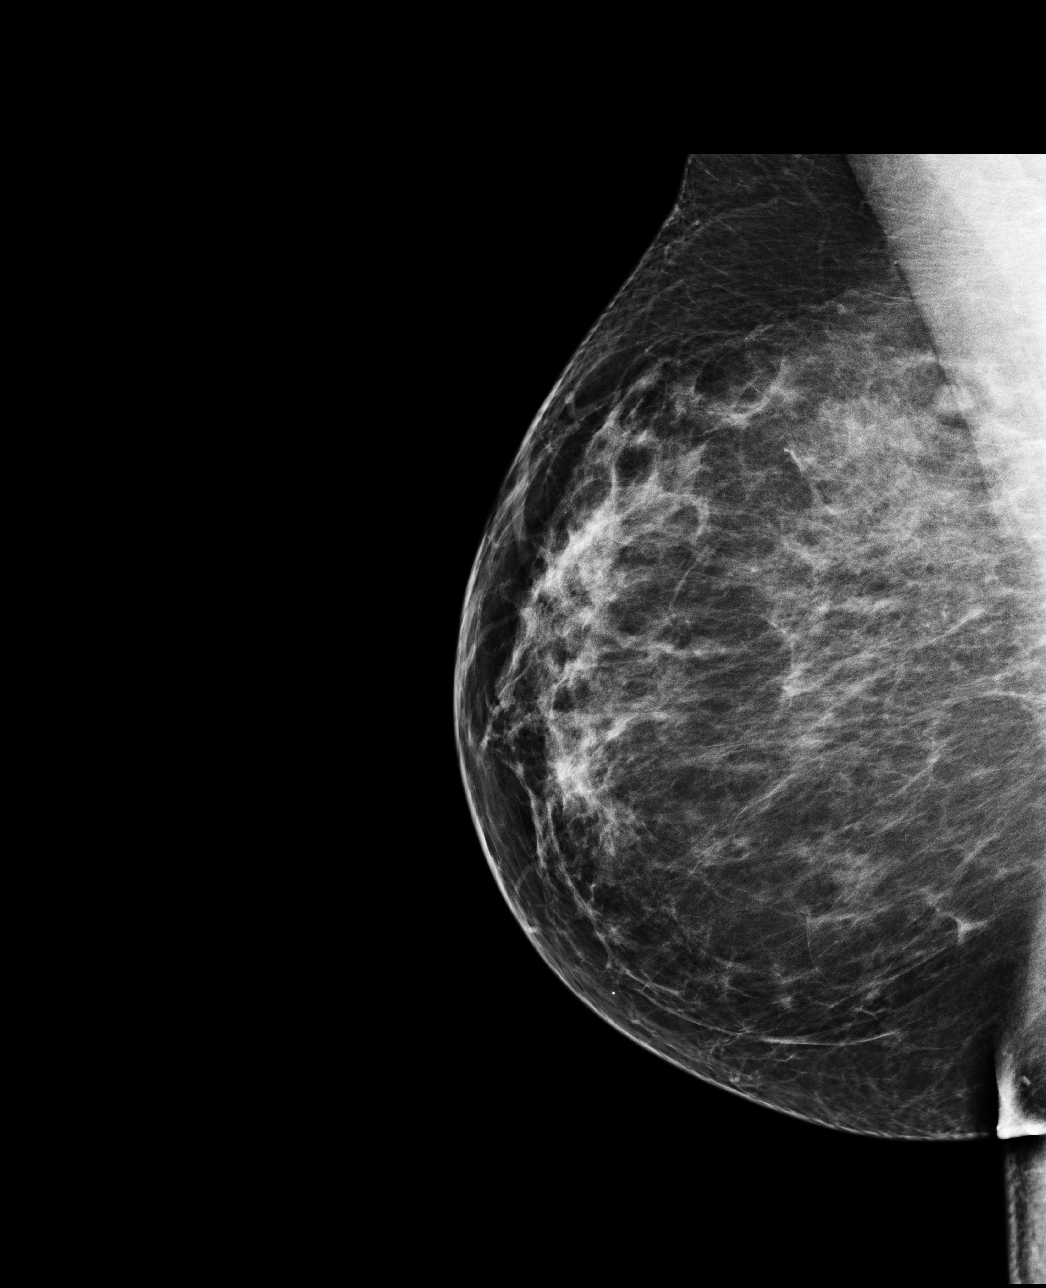

[4 of 4 positions shown; findings below may reference images not displayed]

IMPRESSION: No specific mammographic evidence of malignancy.  Next screening mammogram is recommended in one 
year.

A result letter of this screening mammogram will be mailed directly to the patient.

ASSESSMENT: Negative - BI-RADS 1

Screening mammogram in 1 year.
,

## 2014-09-08 ENCOUNTER — Other Ambulatory Visit: Payer: Self-pay | Admitting: Obstetrics & Gynecology

## 2016-02-25 ENCOUNTER — Encounter (INDEPENDENT_AMBULATORY_CARE_PROVIDER_SITE_OTHER): Payer: Self-pay

## 2016-02-25 ENCOUNTER — Ambulatory Visit (INDEPENDENT_AMBULATORY_CARE_PROVIDER_SITE_OTHER): Payer: Medicaid Other | Admitting: Advanced Practice Midwife

## 2016-02-25 ENCOUNTER — Encounter: Payer: Self-pay | Admitting: Advanced Practice Midwife

## 2016-02-25 VITALS — BP 120/80 | HR 80 | Ht 62.2 in | Wt 258.0 lb

## 2016-02-25 DIAGNOSIS — Z01419 Encounter for gynecological examination (general) (routine) without abnormal findings: Secondary | ICD-10-CM

## 2016-02-25 DIAGNOSIS — Z13220 Encounter for screening for lipoid disorders: Secondary | ICD-10-CM

## 2016-02-25 DIAGNOSIS — Z1329 Encounter for screening for other suspected endocrine disorder: Secondary | ICD-10-CM

## 2016-02-25 DIAGNOSIS — Z0000 Encounter for general adult medical examination without abnormal findings: Secondary | ICD-10-CM | POA: Diagnosis not present

## 2016-02-25 NOTE — Progress Notes (Signed)
Melissa Montaneuth B Gruen 50 y.o.  Vitals:   02/25/16 1056  BP: 120/80  Pulse: 80     Filed Weights   02/25/16 1056  Weight: 258 lb (117 kg)    Past Medical History: History reviewed. No pertinent past medical history.  Past Surgical History: Past Surgical History:  Procedure Laterality Date  . TUBAL LIGATION      Family History: Family History  Problem Relation Age of Onset  . Cancer Maternal Grandmother   . Cancer Father   . Breast cancer Mother     Social History: Social History  Substance Use Topics  . Smoking status: Former Games developermoker  . Smokeless tobacco: Former NeurosurgeonUser  . Alcohol use Not on file    Allergies:  Allergies  Allergen Reactions  . Aspirin   . Nabumetone       Current Outpatient Prescriptions:  .  benztropine (COGENTIN) 1 MG tablet, Take 1 mg by mouth 2 (two) times daily., Disp: , Rfl:  .  citalopram (CELEXA) 20 MG tablet, Take 20 mg by mouth daily., Disp: , Rfl:  .  divalproex (DEPAKOTE) 500 MG DR tablet, Take 500 mg by mouth 3 (three) times daily., Disp: , Rfl:  .  OLANZapine (ZYPREXA) 10 MG tablet, Take 10 mg by mouth at bedtime., Disp: , Rfl:   History of Present Illness: Here for pap and physical.  Last Pap 2014.  Does not have a PCP.     Review of Systems   Patient denies any headaches, blurred vision, shortness of breath, chest pain, abdominal pain, problems with bowel movements, urination, or intercourse.   Physical Exam: General:  Well developed, well nourished, no acute distress Skin:  Warm and dry Neck:  Midline trachea, normal thyroid Lungs; Clear to auscultation bilaterally Breast:  No dominant palpable mass, retraction, or nipple discharge Cardiovascular: Regular rate and rhythm Abdomen:  Soft, non tender, no hepatosplenomegaly Pelvic:  External genitalia is normal in appearance.  The vagina is normal in appearance.  The cervix is difficult to assess d't pt's extreme discomfort w/ speculum exam.  Uterus is felt to be normal size,  shape, and contour.  No adnexal masses or tenderness noted. Exam limited by habitus Extremities:  No swelling or varicosities noted Psych:  No mood changes.  Slow mentation   Impression: normal GYN exam     Plan: if normal, repeat in 3 years Mammogram strongly recommended.   Orders Placed This Encounter  Procedures  . CBC  . Comprehensive metabolic panel    Order Specific Question:   Has the patient fasted?    Answer:   Yes  . Lipid panel    Order Specific Question:   Has the patient fasted?    Answer:   Yes  . TSH

## 2016-02-26 LAB — CYTOLOGY - PAP

## 2016-03-02 LAB — CBC
HEMOGLOBIN: 12.5 g/dL (ref 11.1–15.9)
Hematocrit: 39.8 % (ref 34.0–46.6)
MCH: 27.7 pg (ref 26.6–33.0)
MCHC: 31.4 g/dL — ABNORMAL LOW (ref 31.5–35.7)
MCV: 88 fL (ref 79–97)
Platelets: 239 10*3/uL (ref 150–379)
RBC: 4.52 x10E6/uL (ref 3.77–5.28)
RDW: 14.1 % (ref 12.3–15.4)
WBC: 6.2 10*3/uL (ref 3.4–10.8)

## 2016-03-02 LAB — COMPREHENSIVE METABOLIC PANEL
ALBUMIN: 3.7 g/dL (ref 3.5–5.5)
ALT: 9 IU/L (ref 0–32)
AST: 13 IU/L (ref 0–40)
Albumin/Globulin Ratio: 1.2 (ref 1.2–2.2)
Alkaline Phosphatase: 60 IU/L (ref 39–117)
BUN / CREAT RATIO: 10 (ref 9–23)
BUN: 7 mg/dL (ref 6–24)
CALCIUM: 8.9 mg/dL (ref 8.7–10.2)
CHLORIDE: 105 mmol/L (ref 96–106)
CO2: 24 mmol/L (ref 18–29)
CREATININE: 0.67 mg/dL (ref 0.57–1.00)
GFR, EST AFRICAN AMERICAN: 119 mL/min/{1.73_m2} (ref >59)
GFR, EST NON AFRICAN AMERICAN: 104 mL/min/{1.73_m2} (ref >59)
GLUCOSE: 87 mg/dL (ref 65–99)
Globulin, Total: 3 g/dL (ref 1.5–4.5)
Potassium: 5 mmol/L (ref 3.5–5.2)
Sodium: 143 mmol/L (ref 134–144)
TOTAL PROTEIN: 6.7 g/dL (ref 6.0–8.5)

## 2016-03-02 LAB — LIPID PANEL
CHOL/HDL RATIO: 3.9 ratio (ref 0.0–4.4)
Cholesterol, Total: 206 mg/dL — ABNORMAL HIGH (ref 100–199)
HDL: 53 mg/dL (ref >39)
LDL CALC: 138 mg/dL — AB (ref 0–99)
Triglycerides: 75 mg/dL (ref 0–149)
VLDL CHOLESTEROL CAL: 15 mg/dL (ref 5–40)

## 2016-03-02 LAB — TSH: TSH: 1.72 u[IU]/mL (ref 0.450–4.500)

## 2016-03-03 ENCOUNTER — Other Ambulatory Visit (HOSPITAL_COMMUNITY)
Admission: RE | Admit: 2016-03-03 | Discharge: 2016-03-03 | Disposition: A | Discharge status: Home / Self Care | Payer: Medicaid Other | Source: Ambulatory Visit | Attending: Advanced Practice Midwife | Admitting: Advanced Practice Midwife

## 2016-03-08 ENCOUNTER — Encounter: Payer: Self-pay | Admitting: Advanced Practice Midwife

## 2016-03-29 ENCOUNTER — Other Ambulatory Visit: Payer: Self-pay | Admitting: Adult Health

## 2016-03-29 DIAGNOSIS — Z1231 Encounter for screening mammogram for malignant neoplasm of breast: Secondary | ICD-10-CM

## 2016-04-07 ENCOUNTER — Ambulatory Visit (HOSPITAL_COMMUNITY): Payer: Medicaid Other

## 2016-04-14 ENCOUNTER — Ambulatory Visit (HOSPITAL_COMMUNITY)
Admission: RE | Admit: 2016-04-14 | Discharge: 2016-04-14 | Disposition: A | Discharge status: Home / Self Care | Payer: Medicaid Other | Source: Ambulatory Visit | Attending: Adult Health | Admitting: Adult Health

## 2016-04-14 DIAGNOSIS — Z1231 Encounter for screening mammogram for malignant neoplasm of breast: Secondary | ICD-10-CM | POA: Insufficient documentation

## 2017-01-14 LAB — BASIC METABOLIC PANEL WITH GFR: Glucose: 97

## 2017-01-24 ENCOUNTER — Ambulatory Visit (INDEPENDENT_AMBULATORY_CARE_PROVIDER_SITE_OTHER): Payer: Medicaid Other | Admitting: Obstetrics & Gynecology

## 2017-01-24 ENCOUNTER — Encounter: Payer: Self-pay | Admitting: Obstetrics & Gynecology

## 2017-01-24 VITALS — BP 140/70 | HR 65 | Ht 63.25 in | Wt 264.0 lb

## 2017-01-24 DIAGNOSIS — B373 Candidiasis of vulva and vagina: Secondary | ICD-10-CM | POA: Diagnosis not present

## 2017-01-24 DIAGNOSIS — B3731 Acute candidiasis of vulva and vagina: Secondary | ICD-10-CM

## 2017-01-24 MED ORDER — FLUCONAZOLE 100 MG PO TABS: 100.0000 mg | ORAL_TABLET | Freq: Every day | ORAL | 0 refills | Status: DC

## 2017-01-24 NOTE — Progress Notes (Signed)
       Chief Complaint  Patient presents with  . Vaginal Itching    pelvic pain    Blood pressure 140/70, pulse 65, height 5' 3.25" (1.607 m), weight 264 lb (119.7 kg).  51 y.o. G0P0000 No LMP recorded. Patient is not currently having periods (Reason: Irregular Periods). The current method of family planning is post menopausal status.  Subjective Vaginal discharge for 1weeks Itching yes Irritation yes Odor no Similar to previous no  Previous treatment   Objective Vulva:  Positive candidal vulvovaginitis Vagina:  Positive candidal vulvovaginitis      Pertinent ROS   Labs or studies Wet Prep:   A sample of vaginal discharge was obtained from the posterior fornix using a cotton swab. 2 drops of saline were placed on a slide and the cotton swab was immersed in the saline. Microscopic evaluation was performed and results were as follows:  Positive  for yeast  Negative for clue cells , consistent with Bacterial vaginosis Negative for trichomonas  Normal WBC population   Whiff test: Negative     Impression Diagnoses this Encounter::   ICD-10-CM   1. Candidal vulvovaginitis B37.3     Established relevant diagnosis(es):   Plan/Recommendations: Meds ordered this encounter  Medications  . fluconazole (DIFLUCAN) 100 MG tablet    Sig: Take 1 tablet (100 mg total) by mouth daily.    Dispense:  7 tablet    Refill:  0    Labs or Scans Ordered: No orders of the defined types were placed in this encounter.   Management:: Diflucan daily for the week along with the gentian violet  Follow up Return if symptoms worsen or fail to improve.     All questions were answered.

## 2017-10-11 ENCOUNTER — Encounter (INDEPENDENT_AMBULATORY_CARE_PROVIDER_SITE_OTHER): Payer: Self-pay

## 2017-10-11 ENCOUNTER — Ambulatory Visit (INDEPENDENT_AMBULATORY_CARE_PROVIDER_SITE_OTHER): Payer: Medicaid Other | Admitting: Adult Health

## 2017-10-11 ENCOUNTER — Encounter: Payer: Self-pay | Admitting: Adult Health

## 2017-10-11 VITALS — BP 122/70 | HR 64 | Ht 62.25 in | Wt 252.0 lb

## 2017-10-11 DIAGNOSIS — R232 Flushing: Secondary | ICD-10-CM

## 2017-10-11 DIAGNOSIS — N926 Irregular menstruation, unspecified: Secondary | ICD-10-CM | POA: Diagnosis not present

## 2017-10-11 DIAGNOSIS — R61 Generalized hyperhidrosis: Secondary | ICD-10-CM | POA: Diagnosis not present

## 2017-10-11 DIAGNOSIS — N950 Postmenopausal bleeding: Secondary | ICD-10-CM | POA: Diagnosis not present

## 2017-10-11 DIAGNOSIS — Z780 Asymptomatic menopausal state: Secondary | ICD-10-CM

## 2017-10-11 NOTE — Progress Notes (Signed)
  Subjective:     Patient ID: [name], female   DOB: 03-May-1966, 52 y.o.   MRN: [id number]  HPI [name] is a 52 year old white female in complaining of hot flashes, night sweats and no period for long time then had period. She had pap in 2017, with hyperkeratosis.  PCP is Dr [name] in [place].  Review of Systems No period for long time then had period +hot flashes +night sweats  Reviewed past medical,surgical, social and family history. Reviewed medications and allergies.     Objective:   Physical Exam BP 122/70 (BP Location: Left Arm, Patient Position: Sitting, Cuff Size: Large)   Pulse 64   Ht 5' 2.25" (1.581 m)   Wt 252 lb (114.3 kg)   BMI 45.72 kg/m  Skin warm and dry. Neck: mid line trachea, normal thyroid, good ROM, no lymphadenopathy noted. Lungs: clear to ausculation bilaterally. Cardiovascular: regular rate and rhythm.    Assessment:     1. Menopause   2. Hot flashes   3. Night sweats   4. Irregular periods   5. PMB (postmenopausal bleeding)       Plan:     Return in 1 week for GYN [place] to assess uterus, then see me 2-3 days later for ROS Review handouts on menopause and HRT

## 2017-10-11 NOTE — Patient Instructions (Signed)
Menopause and Hormone Replacement Therapy What is hormone replacement therapy? Hormone replacement therapy (HRT) is the use of artificial (synthetic) hormones to replace hormones that your body stops producing during menopause. Menopause is the normal time of life when menstrual periods stop completely and the ovaries stop producing the female hormones estrogen and progesterone. This lack of hormones can affect your health and cause undesirable symptoms. HRT can relieve some of those symptoms. What are my options for HRT? HRT may consist of the synthetic hormones estrogen and progestin, or it may consist of only estrogen (estrogen-only therapy). You and your health care provider will decide which form of HRT is best for you. If you choose to be on HRT and you have a uterus, estrogen and progestin are usually prescribed. Estrogen-only therapy is used for women who do not have a uterus. Possible options for taking HRT include:  Pills.  Patches.  Gels.  Sprays.  Vaginal cream.  Vaginal rings.  Vaginal inserts.  The amount of hormone(s) that you take and how long you take the hormone(s) varies depending on your individual health. It is important to:  Begin HRT with the lowest possible dosage.  Stop HRT as soon as your health care provider tells you to stop.  Work with your health care provider so that you feel informed and comfortable with your decisions.  What are the benefits of HRT? HRT can reduce the frequency and severity of menopausal symptoms. Benefits of HRT vary depending on the menopausal symptoms that you have, the severity of your symptoms, and your overall health. HRT may help to improve the following menopausal symptoms:  Hot flashes and night sweats. These are sudden feelings of heat that spread over the face and body. The skin may turn red, like a blush. Night sweats are hot flashes that happen while you are sleeping or trying to sleep.  Bone loss (osteoporosis). The  body loses calcium more quickly after menopause, causing the bones to become weaker. This can increase the risk for bone breaks (fractures).  Vaginal dryness. The lining of the vagina can become thin and dry, which can cause pain during sexual intercourse or cause infection, burning, or itching.  Urinary tract infections.  Urinary incontinence. This is a decreased ability to control when you urinate.  Irritability.  Short-term memory problems.  What are the risks of HRT? Risks of HRT vary depending on your individual health and medical history. Risks of HRT also depend on whether you receive both estrogen and progestin or you receive estrogen only.HRT may increase the risk of:  Spotting. This is when a small amount of bloodleaks from the vagina unexpectedly.  Endometrial cancer. This cancer is in the lining of the uterus (endometrium).  Breast cancer.  Increased density of breast tissue. This can make it harder to find breast cancer on a breast X-ray (mammogram).  Stroke.  Heart attack.  Blood clots.  Gallbladder disease.  Risks of HRT can increase if you have any of the following conditions:  Endometrial cancer.  Liver disease.  Heart disease.  Breast cancer.  History of blood clots.  History of stroke.  How should I care for myself while I am on HRT?  Take over-the-counter and prescription medicines only as told by your health care provider.  Get mammograms, pelvic exams, and medical checkups as often as told by your health care provider.  Have Pap tests done as often as told by your health care provider. A Pap test is sometimes called a Pap   smear. It is a screening test that is used to check for signs of cancer of the cervix and vagina. A Pap test can also identify the presence of infection or precancerous changes. Pap tests may be done: ? Every 3 years, starting at age 21. ? Every 5 years, starting after age 30, in combination with testing for human  papillomavirus (HPV). ? More often or less often depending on other medical conditions you have, your age, and other risk factors.  It is your responsibility to get your Pap test results. Ask your health care provider or the department performing the test when your results will be ready.  Keep all follow-up visits as told by your health care provider. This is important. When should I seek medical care? Talk with your health care provider if:  You have any of these: ? Pain or swelling in your legs. ? Shortness of breath. ? Chest pain. ? Lumps or changes in your breasts or armpits. ? Slurred speech. ? Pain, burning, or bleeding when you urine.  You develop any of these: ? Unusual vaginal bleeding. ? Dizziness or headaches. ? Weakness or numbness in any part of your arms or legs. ? Pain in your abdomen.  This information is not intended to replace advice given to you by your health care provider. Make sure you discuss any questions you have with your health care provider. Document Released: 02/12/2003 Document Revised: 04/12/2016 Document Reviewed: 11/17/2014 Elsevier Interactive Patient Education  2017 [employer] Menopause Menopause is the normal time of life when menstrual periods stop completely. Menopause is complete when you have missed 12 consecutive menstrual periods. It usually occurs between the ages of 48 years and 55 years. Very rarely does a woman develop menopause before the age of 40 years. At menopause, your ovaries stop producing the female hormones estrogen and progesterone. This can cause undesirable symptoms and also affect your health. Sometimes the symptoms may occur 4-5 years before the menopause begins. There is no relationship between menopause and:  Oral contraceptives.  Number of children you had.  Race.  The age your menstrual periods started (menarche).  Heavy smokers and very thin women may develop menopause earlier in life. What are the  causes?  The ovaries stop producing the female hormones estrogen and progesterone. Other causes include:  Surgery to remove both ovaries.  The ovaries stop functioning for no known reason.  Tumors of the pituitary gland in the brain.  Medical disease that affects the ovaries and hormone production.  Radiation treatment to the abdomen or pelvis.  Chemotherapy that affects the ovaries.  What are the signs or symptoms?  Hot flashes.  Night sweats.  Decrease in sex drive.  Vaginal dryness and thinning of the vagina causing painful intercourse.  Dryness of the skin and developing wrinkles.  Headaches.  Tiredness.  Irritability.  Memory problems.  Weight gain.  Bladder infections.  Hair growth of the face and chest.  Infertility. More serious symptoms include:  Loss of bone (osteoporosis) causing breaks (fractures).  Depression.  Hardening and narrowing of the arteries (atherosclerosis) causing heart attacks and strokes.  How is this diagnosed?  When the menstrual periods have stopped for 12 straight months.  Physical exam.  Hormone studies of the blood. How is this treated? There are many treatment choices and nearly as many questions about them. The decisions to treat or not to treat menopausal changes is an individual choice made with your health care provider. Your health care provider can discuss   the treatments with you. Together, you can decide which treatment will work best for you. Your treatment choices may include:  Hormone therapy (estrogen and progesterone).  Non-hormonal medicines.  Treating the individual symptoms with medicine (for example antidepressants for depression).  Herbal medicines that may help specific symptoms.  Counseling by a psychiatrist or psychologist.  Group therapy.  Lifestyle changes including: ? Eating healthy. ? Regular exercise. ? Limiting caffeine and alcohol. ? Stress management and meditation.  No  treatment.  Follow these instructions at home:  Take the medicine your health care provider gives you as directed.  Get plenty of sleep and rest.  Exercise regularly.  Eat a diet that contains calcium (good for the bones) and soy products (acts like estrogen hormone).  Avoid alcoholic beverages.  Do not smoke.  If you have hot flashes, dress in layers.  Take supplements, calcium, and vitamin D to strengthen bones.  You can use over-the-counter lubricants or moisturizers for vaginal dryness.  Group therapy is sometimes very helpful.  Acupuncture may be helpful in some cases. Contact a health care provider if:  You are not sure you are in menopause.  You are having menopausal symptoms and need advice and treatment.  You are still having menstrual periods after age 55 years.  You have pain with intercourse.  Menopause is complete (no menstrual period for 12 months) and you develop vaginal bleeding.  You need a referral to a specialist (gynecologist, psychiatrist, or psychologist) for treatment. Get help right away if:  You have severe depression.  You have excessive vaginal bleeding.  You fell and think you have a broken bone.  You have pain when you urinate.  You develop leg or chest pain.  You have a fast pounding heart beat (palpitations).  You have severe headaches.  You develop vision problems.  You feel a lump in your breast.  You have abdominal pain or severe indigestion. This information is not intended to replace advice given to you by your health care provider. Make sure you discuss any questions you have with your health care provider. Document Released: 08/06/2003 Document Revised: 10/22/2015 Document Reviewed: 12/13/2012 Elsevier Interactive Patient Education  2017 [employer] Inc[employer]  

## 2017-10-18 ENCOUNTER — Other Ambulatory Visit: Payer: Medicaid Other

## 2017-10-24 ENCOUNTER — Ambulatory Visit: Payer: Medicaid Other | Admitting: Adult Health

## 2017-10-25 ENCOUNTER — Other Ambulatory Visit: Payer: Medicaid Other

## 2017-11-02 ENCOUNTER — Ambulatory Visit (INDEPENDENT_AMBULATORY_CARE_PROVIDER_SITE_OTHER): Payer: Medicaid Other

## 2017-11-02 DIAGNOSIS — N950 Postmenopausal bleeding: Secondary | ICD-10-CM | POA: Diagnosis not present

## 2017-11-02 DIAGNOSIS — R9389 Abnormal findings on diagnostic imaging of other specified body structures: Secondary | ICD-10-CM | POA: Diagnosis not present

## 2017-11-02 DIAGNOSIS — N83202 Unspecified ovarian cyst, left side: Secondary | ICD-10-CM

## 2017-11-02 DIAGNOSIS — N926 Irregular menstruation, unspecified: Secondary | ICD-10-CM

## 2017-11-02 NOTE — Progress Notes (Signed)
PELVIC US TA/TV: Homogeneous anteverted uterus,wnl,thickened endometrium 9.8 mm,normal right ovary,simple left ovarian cyst 1.8 x 1.4 x 1.6 cm,small amount of simple left adnexal fluid

## 2017-11-06 ENCOUNTER — Ambulatory Visit (INDEPENDENT_AMBULATORY_CARE_PROVIDER_SITE_OTHER): Payer: Medicaid Other | Admitting: Adult Health

## 2017-11-06 ENCOUNTER — Encounter: Payer: Self-pay | Admitting: Adult Health

## 2017-11-06 VITALS — BP 109/72 | HR 66 | Ht 62.25 in | Wt 258.5 lb

## 2017-11-06 DIAGNOSIS — R61 Generalized hyperhidrosis: Secondary | ICD-10-CM | POA: Diagnosis not present

## 2017-11-06 DIAGNOSIS — R232 Flushing: Secondary | ICD-10-CM | POA: Diagnosis not present

## 2017-11-06 DIAGNOSIS — R9389 Abnormal findings on diagnostic imaging of other specified body structures: Secondary | ICD-10-CM

## 2017-11-06 DIAGNOSIS — N83202 Unspecified ovarian cyst, left side: Secondary | ICD-10-CM

## 2017-11-06 NOTE — Patient Instructions (Signed)
Endometrial Biopsy  Endometrial biopsy is a procedure in which a tissue sample is taken from inside the uterus. The sample is taken from the endometrium, which is the lining of the uterus. The tissue sample is then checked under a microscope to see if the tissue is normal or abnormal. This procedure helps to determine where you are in your menstrual cycle and how hormone levels are affecting the lining of the uterus. This procedure may also be used to evaluate uterine bleeding or to diagnose endometrial cancer, endometrial tuberculosis, polyps, or other inflammatory conditions.  Tell a health care provider about:   Any allergies you have.   All medicines you are taking, including vitamins, herbs, eye drops, creams, and over-the-counter medicines.   Any problems you or family members have had with anesthetic medicines.   Any blood disorders you have.   Any surgeries you have had.   Any medical conditions you have.   Whether you are pregnant or may be pregnant.  What are the risks?  Generally, this is a safe procedure. However, problems may occur, including:   Bleeding.   Pelvic infection.   Puncture of the wall of the uterus with the biopsy device (rare).    What happens before the procedure?   Keep a record of your menstrual cycles as told by your health care provider. You may need to schedule your procedure for a specific time in your cycle.   You may want to bring a sanitary pad to wear after the procedure.   Ask your health care provider about:  ? Changing or stopping your regular medicines. This is especially important if you are taking diabetes medicines or blood thinners.  ? Taking medicines such as aspirin and ibuprofen. These medicines can thin your blood. Do not take these medicines before your procedure if your health care provider instructs you not to.   Plan to have someone take you home from the hospital or clinic.  What happens during the procedure?   To lower your risk of  infection:  ? Your health care team will wash or sanitize their hands.   You will lie on an exam table with your feet and legs supported as in a pelvic exam.   Your health care provider will insert an instrument (speculum) into your vagina to see your cervix.   Your cervix will be cleansed with an antiseptic solution.   A medicine (local anesthetic) will be used to numb the cervix.   A forceps instrument (tenaculum) will be used to hold your cervix steady for the biopsy.   A thin, rod-like instrument (uterine sound) will be inserted through your cervix to determine the length of your uterus and the location where the biopsy sample will be removed.   A thin, flexible tube (catheter) will be inserted through your cervix and into the uterus. The catheter will be used to collect the biopsy sample from your endometrial tissue.   The catheter and speculum will then be removed, and the tissue sample will be sent to a lab for examination.  What happens after the procedure?   You will rest in a recovery area until you are ready to go home.   You may have mild cramping and a small amount of vaginal bleeding. This is normal.   It is up to you to get the results of your procedure. Ask your health care provider, or the department that is doing the procedure, when your results will be ready.  Summary     Endometrial biopsy is a procedure in which a tissue sample is taken from the endometrium, which is the lining of the uterus.   This procedure may help to diagnose menstrual cycle problems, abnormal bleeding, or other conditions affecting the endometrium.   Before the procedure, keep a record of your menstrual cycles as told by your health care provider.   The tissue sample that is removed will be checked under a microscope to see if it is normal or abnormal.  This information is not intended to replace advice given to you by your health care provider. Make sure you discuss any questions you have with your health care  provider.  Document Released: 09/16/2004 Document Revised: 06/01/2016 Document Reviewed: 06/01/2016  Elsevier Interactive Patient Education  2017 [employer]

## 2017-11-06 NOTE — Progress Notes (Signed)
  Subjective:     Patient ID: [name], female   DOB: 01/04/1966, 52 y.o.   MRN: [id number]  HPI [name] is a 52 year old white female, in to discuss US results.  Review of Systems +hot flashes and night sweats No bleeding now Reviewed past medical,surgical, social and family history. [name] medications and allergies.     Objective:   Physical Exam BP 109/72 (BP Location: Left Arm, Patient Position: Sitting, Cuff Size: Large)   Pulse 66   Ht 5' 2.25" (1.581 m)   Wt 258 lb 8 oz (117.3 kg)   BMI 46.90 kg/m   Reviewed US results:  Imaging  Date: 11/02/2017 Department: [facility] Released By: Elana AlmManley, Rosalynd Mcwright Authorizing: Adline PotterGriffin, Oleg Oleson A, NP  Exam Information   Status Exam Begun  Exam Ended   Final [99] 11/02/2017 1:10 PM 11/02/2017     Show images for US PELVIS TRANSVANGINAL NON-OB (TV ONLY)  Study Result   GYNECOLOGIC SONOGRAM   Sheila MontaneRuth B Moreno is a 52 y.o. G0P0000 she is here for a pelvic sonogram for postmenopausal bleeding.  Uterus                      9.5 x 4.1 x 6.5 cm, vol 131 ml, homogeneous anteverted uterus,wnl  Endometrium          9.8 mm, symmetrical, homogeneous thickened endometrium  Right ovary             3.3 x 2.3 x 2.7 cm, wnl  Left ovary                2.2 x 2.4 x 2 cm, simple left ovarian cyst 1.8 x 1.4 x 1.6 cm,  small amount of simple left adnexal fluid  Technician Comments:  PELVIC US TA/TV: Homogeneous anteverted uterus,wnl,thickened endometrium 9.8 mm,normal right ovary,simple left ovarian cyst 1.8 x 1.4 x 1.6 cm,small amount of simple left adnexal fluid,ovaries appear mobile    E. I. du Pontmber J Carl 11/02/2017 1:42 PM  Clinical Impression and recommendations:  I have reviewed the sonogram results above.  Combined with the patient's current clinical course, below are my impressions and any appropriate recommendations for management based on the sonographic findings:  1. Slightly thickened endometrium, [facility]  [facility]  And endometrial biopsy recommended. 2. Normal adnexa.   Sheila BurrowJohn Moreno Ferguson   Result History   US PELVIS TRANSVANGINAL NON-OB (TV ONLY) (Order [id number]) on 11/04/2017 - Order Result History Report    Height Weight BMI (Calculated)  5' 2.25" (1.581 m) 258 lb 8 oz (117.3 kg)   External Result Report   External Result Report  Imaging   Imaging Information  Signed by   Signed Date/Time  Phone Pager  [name] 11/04/2017 6:37 AM [phone]   Signed   Electronically signed by Sheila BurrowFerguson, John V, MD on 11/04/17 at [id number] EDT  Original Order   Ordered On Ordered By   10/11/2017 2:00 PM Adline PotterGriffin, Kaylla Cobos A, NP       [facility] and [facility] endometrial biopsy.      Assessment:     1. Thickened endometrium   2. Hot flashes   3. Night sweats       Plan:     [facility] 2 weeks for [facility] biopsy  Review handout on endometrial biopsy.spoke with her sister and guardian Renata CapriceSue Kings, [phone], per T[place]ley's request.

## 2017-11-07 LAB — FOLLICLE STIMULATING HORMONE: FSH: 16.9 m[IU]/mL

## 2017-11-08 ENCOUNTER — Telehealth: Payer: Self-pay | Admitting: Adult Health

## 2017-11-08 NOTE — Telephone Encounter (Signed)
[name] aware that [facility] 16.9, keep appt 6/24 at 2 pm with Dr Despina HiddenEure, he may perform endo biopsy or may give provera to get withdrawal bleed and recheck [place]

## 2017-11-20 ENCOUNTER — Encounter: Payer: Self-pay | Admitting: Obstetrics & Gynecology

## 2017-11-20 ENCOUNTER — Ambulatory Visit (INDEPENDENT_AMBULATORY_CARE_PROVIDER_SITE_OTHER): Payer: Medicaid Other | Admitting: Obstetrics & Gynecology

## 2017-11-20 VITALS — BP 116/69 | HR 79 | Ht 62.0 in | Wt 255.0 lb

## 2017-11-20 DIAGNOSIS — Z3202 Encounter for pregnancy test, result negative: Secondary | ICD-10-CM | POA: Diagnosis not present

## 2017-11-20 DIAGNOSIS — N950 Postmenopausal bleeding: Secondary | ICD-10-CM | POA: Diagnosis not present

## 2017-11-20 DIAGNOSIS — R9389 Abnormal findings on diagnostic imaging of other specified body structures: Secondary | ICD-10-CM

## 2017-11-20 LAB — POCT URINE PREGNANCY: PREG TEST UR: NEGATIVE

## 2017-11-20 MED ORDER — MEDROXYPROGESTERONE ACETATE 10 MG PO TABS: 10.0000 mg | ORAL_TABLET | Freq: Every day | ORAL | 0 refills | Status: DC

## 2017-11-20 NOTE — Progress Notes (Signed)
Endometrial Biopsy Procedure Note  Pre-operative Diagnosis: thickened endometrium, perimenopausal bleeding  Post-operative Diagnosis: unable to perform due to anatomy  Indications: perimenopausal bleeding and thickened endometrium  Procedure Details   Urine pregnancy test was done  and result was negative.  The risks (including infection, bleeding, pain, and uterine perforation) and benefits of the procedure were explained to the patient and Written informed consent was obtained.  Antibiotic prophylaxis against endocarditis was not indicated.   The patient was placed in the dorsal lithotomy position.  Bimanual exam showed the uterus to be in the anteroflexed position.  A Graves' speculum inserted in the vagina, and the cervix prepped with povidone iodine.  Endocervical curettage with a Kevorkian curette was not performed.   A sharp tenaculum was applied to the anterior lip of the cervix for stabilization.  A sterile uterine sound was used to sound the uterus to a depth of cm.  I was unable to perform the biospy due to anatomy, her uterus is very anteverted and the cervix is very high and pointing straight down. Condition: Stable  Complications: None  Plan: Will cycle with provera x 10 days then remeasur her endometrial stripe, if it is less then I may just cycle her each month and recheck ES 6-12 months in the future  The patient was advised to call for any fever or for prolonged or severe pain or bleeding. She was advised to use OTC analgesics as needed for mild to moderate pain. She was advised to avoid vaginal intercourse for 48 hours or until the bleeding has completely stopped.  Meds ordered this encounter  Medications  . medroxyPROGESTERone (PROVERA) 10 MG tablet    Sig: Take 1 tablet (10 mg total) by mouth daily.    Dispense:  10 tablet    Refill:  0   Orders Placed This Encounter  Procedures  . US PELVIS TRANSVANGINAL NON-OB (TV ONLY)    Standing Status:   Future   Standing Expiration Date:   01/21/2019    Order Specific Question:   Reason for Exam (SYMPTOM  OR DIAGNOSIS REQUIRED)    Answer:   measure endometrial stripe after provera    Order Specific Question:   Preferred imaging location?    Answer:   Internal  . POCT urine pregnancy       Face to face time:  10 minutes  Greater than 50% of the visit time was spent in counseling and coordination of care with the patient.  The summary and outline of the counseling and care coordination is summarized in the note above.   All questions were answered.

## 2017-12-11 ENCOUNTER — Other Ambulatory Visit: Payer: Self-pay | Admitting: Obstetrics & Gynecology

## 2017-12-11 DIAGNOSIS — N950 Postmenopausal bleeding: Secondary | ICD-10-CM

## 2017-12-11 DIAGNOSIS — R9389 Abnormal findings on diagnostic imaging of other specified body structures: Secondary | ICD-10-CM

## 2017-12-12 ENCOUNTER — Ambulatory Visit (INDEPENDENT_AMBULATORY_CARE_PROVIDER_SITE_OTHER): Payer: Medicaid Other | Admitting: Obstetrics & Gynecology

## 2017-12-12 ENCOUNTER — Encounter: Payer: Self-pay | Admitting: Obstetrics & Gynecology

## 2017-12-12 ENCOUNTER — Other Ambulatory Visit: Payer: Self-pay

## 2017-12-12 ENCOUNTER — Ambulatory Visit (INDEPENDENT_AMBULATORY_CARE_PROVIDER_SITE_OTHER): Payer: Medicaid Other

## 2017-12-12 VITALS — BP 124/71 | HR 72 | Ht 64.0 in | Wt 255.0 lb

## 2017-12-12 DIAGNOSIS — N950 Postmenopausal bleeding: Secondary | ICD-10-CM

## 2017-12-12 DIAGNOSIS — R9389 Abnormal findings on diagnostic imaging of other specified body structures: Secondary | ICD-10-CM | POA: Diagnosis not present

## 2017-12-12 MED ORDER — MEDROXYPROGESTERONE ACETATE 10 MG PO TABS: 10.0000 mg | ORAL_TABLET | Freq: Every day | ORAL | 11 refills | Status: DC

## 2017-12-12 NOTE — Progress Notes (Signed)
PELVIC US TA/TV: homogeneous anteverted uterus,WNL,EEC 6 mm (limited view),normal ovaries bilat,small amount of simple left adnexa fluid,ovaries appear mobile,some discomfort left adnexa

## 2017-12-12 NOTE — Progress Notes (Signed)
Follow up appointment for results  Chief Complaint  Patient presents with  . Follow-up    gyn u/s today    Blood pressure 124/71, pulse 72, height 5\' 4"  (1.626 m), weight 255 lb (115.7 kg).  GYNECOLOGIC SONOGRAM   [name] is a 52 y.o. G0P0000 she is here for a follow up pelvic sonogram, for postmenopausal bleeding with a thickened endometrium .  Uterus                      9.2 x 3.7 x 5.6 cm,Vol 99 ml,  homogeneous anteverted uterus,WNL  Endometrium          6 mm, symmetrical, thickened (limited view)  Right ovary             2.5 x 1.9 x 2.4 cm, wnl  Left ovary                2.1 x 1.3 x 1.5 cm, wnl  Small amount of simple left adnexa fluid  Technician Comments:  PELVIC US TA/TV: homogeneous anteverted uterus,WNL,EEC 6 mm (limited view),normal ovaries bilat,small amount of simple left adnexa fluid,ovaries appear mobile,some discomfort left adnexa    Karie Chimera 12/12/2017 2:32 PM  Clinical Impression and recommendations:  I have reviewed the sonogram results above, combined with the patient's current clinical course, below are my impressions and any appropriate recommendations for management based on the sonographic findings.  Uterus normal Endometrium minimally thickened for a postmenopausal woman Ovaries normal bilaterally  Recommend sampling   Luther H Eure    EMB was attempted but could not perform due to anatomy  MEDS ordered this encounter: Meds ordered this encounter  Medications  . medroxyPROGESTERone (PROVERA) 10 MG tablet    Sig: Take 1 tablet (10 mg total) by mouth daily. As directed    Dispense:  30 tablet    Refill:  11    Orders for this encounter: No orders of the defined types were placed in this encounter.   Impression: PMB (postmenopausal bleeding)    Plan: Cyclical provera 10 mg 10 days monthly for endometrial suppression  Follow Up: Return in about 1 year (around 12/13/2018) for Follow up, with  Dr Despina Hidden.       Face to face time:  15 minutes  Greater than 50% of the visit time was spent in counseling and coordination of care with the patient.  The summary and outline of the counseling and care coordination is summarized in the note above.   All questions were answered.  Past Medical History:  Diagnosis Date  . Depression   . Fatty liver   . Seizures (HCC)     Past Surgical History:  Procedure Laterality Date  . TUBAL LIGATION      OB History    Gravida  0   Para  0   Term  0   Preterm  0   AB  0   Living  0     SAB  0   TAB  0   Ectopic  0   Multiple  0   Live Births  0           Allergies  Allergen Reactions  . Aspirin   . Nabumetone     Social History   Socioeconomic History  . Marital status: Single    Spouse name: Not on file  . Number of children: Not on file  . Years of education: Not on file  .  Highest education level: Not on file  Occupational History  . Not on file  Social Needs  . Financial resource strain: Not on file  . Food insecurity:    Worry: Not on file    Inability: Not on file  . Transportation needs:    Medical: Not on file    Non-medical: Not on file  Tobacco Use  . Smoking status: Former Smoker    Types: Cigarettes  . Smokeless tobacco: Never Used  Substance and Sexual Activity  . Alcohol use: No  . Drug use: No  . Sexual activity: Not Currently    Birth control/protection: Surgical    Comment: tubal  Lifestyle  . Physical activity:    Days per week: Not on file    Minutes per session: Not on file  . Stress: Not on file  Relationships  . Social connections:    Talks on phone: Not on file    Gets together: Not on file    Attends religious service: Not on file    Active member of club or organization: Not on file    Attends meetings of clubs or organizations: Not on file    Relationship status: Not on file  Other Topics Concern  . Not on file  Social History Narrative  . Not on file     Family History  Problem Relation Age of Onset  . Cancer Maternal Grandmother   . Cancer Father        lung  . COPD Father   . Breast cancer Mother   . Other Paternal Grandfather        died after blood transfusion  . Cancer Maternal Grandfather        lung

## 2017-12-26 ENCOUNTER — Telehealth: Payer: Self-pay | Admitting: *Deleted

## 2017-12-26 NOTE — Telephone Encounter (Addendum)
Called patient back to discuss her concerns, no answer and no voicemail. Will try to call her again tomorrow.

## 2017-12-28 NOTE — Telephone Encounter (Signed)
Called patient again . No answer , no voicemail

## 2018-01-08 ENCOUNTER — Telehealth: Payer: Self-pay | Admitting: Obstetrics and Gynecology

## 2018-01-08 NOTE — Telephone Encounter (Signed)
Returned patient's call but sister states she is not with the patient but will have her call us back.

## 2018-12-20 ENCOUNTER — Ambulatory Visit: Payer: Self-pay | Admitting: Adult Health

## 2018-12-20 ENCOUNTER — Encounter: Payer: Self-pay | Admitting: Adult Health

## 2018-12-20 ENCOUNTER — Other Ambulatory Visit: Payer: Self-pay

## 2018-12-20 ENCOUNTER — Ambulatory Visit (INDEPENDENT_AMBULATORY_CARE_PROVIDER_SITE_OTHER): Payer: Medicaid Other | Admitting: Adult Health

## 2018-12-20 VITALS — BP 106/69 | HR 64 | Ht 64.0 in | Wt 242.0 lb

## 2018-12-20 DIAGNOSIS — N950 Postmenopausal bleeding: Secondary | ICD-10-CM | POA: Diagnosis not present

## 2018-12-20 DIAGNOSIS — N907 Vulvar cyst: Secondary | ICD-10-CM

## 2018-12-20 DIAGNOSIS — R102 Pelvic and perineal pain: Secondary | ICD-10-CM

## 2018-12-20 MED ORDER — SULFAMETHOXAZOLE-TRIMETHOPRIM 800-160 MG PO TABS: 1.0000 | ORAL_TABLET | Freq: Two times a day (BID) | ORAL | 0 refills | Status: DC

## 2018-12-20 NOTE — Progress Notes (Signed)
Patient ID: [name], female   DOB: November 16, 1965, 53 y.o.   MRN: [id number] History of Present Illness: [name] is a 53 year old white female, single,G0P0 in complaining of vaginal pain and bump left labia that comes and goes.  PCP is Dr Manuella Ghazi.   Current Medications, Allergies, Past Medical History, Past Surgical History, Family History and Social History were reviewed in [employer] record.     Review of Systems: +pain in vagina Bump left labia that comes and goes, sometimes bigger that other Has spotted some on provera, HX PMB and slightly thickened endometrium     Physical Exam:BP 106/69 (BP Location: Left Arm, Patient Position: Sitting, Cuff Size: Large)   Pulse 64   Ht 5\' 4"  (1.626 m)   Wt 242 lb (109.8 kg)   BMI 41.54 kg/m  General:  Well developed, well nourished, no acute distress Skin:  Warm and dry Pelvic:  External genitalia is normal in appearance, has small epidermal cyst left labia tender.  The vagina is normal in appearance. Urethra has no lesions or masses. The cervix is smooth.  Uterus is felt to be normal size, shape, and contour.  No adnexal masses or tenderness noted.Bladder is non tender, no masses felt. Psych:  No mood changes, alert and cooperative,seems happy Fall risk is low. Will rx septra ds and get follow up US and then pap and physical. Examination chaperoned by Levy Pupa LPN.   Impression: 1. Epidermal cyst of vulva   2. Vaginal pain   3. PMB (postmenopausal bleeding)       Plan: Meds ordered this encounter  Medications  . sulfamethoxazole-trimethoprim (BACTRIM DS) 800-160 MG tablet    Sig: Take 1 tablet by mouth 2 (two) times daily. Take 1 bid    Dispense:  20 tablet    Refill:  0    Order Specific Question:   Supervising Provider    Answer:   Tania Ade H [[id number]]  Return in about 3 months for pap and physical but get [place] about 2 weeks before that

## 2018-12-24 ENCOUNTER — Ambulatory Visit: Payer: Self-pay | Admitting: Obstetrics & Gynecology

## 2018-12-25 ENCOUNTER — Other Ambulatory Visit: Payer: Self-pay | Admitting: Obstetrics & Gynecology

## 2018-12-25 NOTE — Telephone Encounter (Signed)
Pharmacy checking status on medication Provera being refilled for this pt.

## 2018-12-26 MED ORDER — MEDROXYPROGESTERONE ACETATE 10 MG PO TABS: 10.0000 mg | ORAL_TABLET | Freq: Every day | ORAL | 3 refills | Status: DC

## 2018-12-26 NOTE — Addendum Note (Signed)
Addended by: Christiana Pellant A on: 12/26/2018 09:23 AM   Modules accepted: Orders

## 2019-03-06 ENCOUNTER — Telehealth: Payer: Self-pay | Admitting: Obstetrics & Gynecology

## 2019-03-06 NOTE — Telephone Encounter (Signed)

## 2019-03-07 ENCOUNTER — Other Ambulatory Visit: Payer: Self-pay

## 2019-03-07 ENCOUNTER — Ambulatory Visit (INDEPENDENT_AMBULATORY_CARE_PROVIDER_SITE_OTHER): Payer: Medicaid Other

## 2019-03-07 DIAGNOSIS — N950 Postmenopausal bleeding: Secondary | ICD-10-CM

## 2019-03-07 NOTE — Progress Notes (Signed)
PELVIC US TA/TV: homogeneous axial positioned uterus,wnl,EEC 3.3 mm,normal ovaries bilat,limited view of ovaries,no pain during ultrasound,small amount of simple cul de sac fluid Chaperone: Marcie Bal

## 2019-03-11 ENCOUNTER — Telehealth: Payer: Self-pay | Admitting: Adult Health

## 2019-03-11 NOTE — Telephone Encounter (Signed)
Left message that US showed normal uterus, ovaries and EEC 3.3 mm, no biopsy needed.Will see you 10/23 for physical

## 2019-03-21 ENCOUNTER — Telehealth: Payer: Self-pay | Admitting: Adult Health

## 2019-03-21 NOTE — Telephone Encounter (Signed)

## 2019-03-22 ENCOUNTER — Encounter: Payer: Self-pay | Admitting: Adult Health

## 2019-03-22 ENCOUNTER — Other Ambulatory Visit: Payer: Self-pay

## 2019-03-22 ENCOUNTER — Other Ambulatory Visit (HOSPITAL_COMMUNITY)
Admission: RE | Admit: 2019-03-22 | Discharge: 2019-03-22 | Disposition: A | Discharge status: Home / Self Care | Payer: Medicaid Other | Source: Ambulatory Visit | Attending: Adult Health | Admitting: Adult Health

## 2019-03-22 ENCOUNTER — Ambulatory Visit (INDEPENDENT_AMBULATORY_CARE_PROVIDER_SITE_OTHER): Payer: Medicaid Other | Admitting: Adult Health

## 2019-03-22 VITALS — BP 110/75 | HR 60 | Ht 63.0 in | Wt 235.0 lb

## 2019-03-22 DIAGNOSIS — Z0000 Encounter for general adult medical examination without abnormal findings: Secondary | ICD-10-CM

## 2019-03-22 DIAGNOSIS — Z01419 Encounter for gynecological examination (general) (routine) without abnormal findings: Secondary | ICD-10-CM

## 2019-03-22 DIAGNOSIS — Z1212 Encounter for screening for malignant neoplasm of rectum: Secondary | ICD-10-CM | POA: Diagnosis not present

## 2019-03-22 DIAGNOSIS — Z1211 Encounter for screening for malignant neoplasm of colon: Secondary | ICD-10-CM | POA: Insufficient documentation

## 2019-03-22 LAB — HEMOCCULT GUIAC POC 1CARD (OFFICE): Fecal Occult Blood, POC: NEGATIVE

## 2019-03-22 NOTE — Progress Notes (Signed)
Patient ID: [name], female   DOB: 12/26/1965, 53 y.o.   MRN: [id number] History of Present Illness: [name] is a 53 year old white female, single, PM in for well woman gyn exam and pap. She says she had not had any bleeding. PCP is Dr Woody Seller now.   Current Medications, Allergies, Past Medical History, Past Surgical History, Family History and Social History were reviewed in [employer] record.     Review of Systems: Patient denies any headaches, hearing loss, fatigue, blurred vision, shortness of breath,  abdominal pain, problems with  urination, or intercourse(not active). No joint pain or mood swings. Had chest pain last month for about 10 minutes, and it stopped, but did radiate down left arm.She is having som constipation  She is poor historian and sister did not come with her.Called sister about meds and she is not sure, if list is current or not.   Physical Exam:BP 110/75 (BP Location: Left Arm, Patient Position: Sitting, Cuff Size: Normal)   Pulse 60   Ht 5\' 3"  (1.6 m)   Wt 235 lb (106.6 kg)   BMI 41.63 kg/m  General:  Well developed, well nourished, no acute distress, not well kept Skin:  Warm and dry Neck:  Midline trachea, normal thyroid, good ROM, no lymphadenopathy Lungs; Clear to auscultation bilaterally Breast:  No dominant palpable mass, retraction, or nipple discharge, has some tenderness on left breast LOQ Cardiovascular: Regular rate and rhythm Abdomen:  Soft, non tender, no hepatosplenomegaly Pelvic:  External genitalia is normal in appearance, no lesions.  The vagina is normal in appearance. Urethra has no lesions or masses. The cervix is nulliparous and poorly seen, pap with high risk HPV 16/18 performed .  Uterus is felt to be normal size, shape, and contour.  No adnexal masses or tenderness noted.Bladder is non tender, no masses felt. Rectal: Good sphincter tone, no polyps, or hemorrhoids felt.  Hemoccult negative.Stool in  vault Extremities/musculoskeletal:  No swelling or varicosities noted, no clubbing or cyanosis Psych:  No mood changes, alert and cooperative,seems happy Fall risk is low PHQ 9 score is 6, denies being suicidal  And is on meds Examination chaperoned by Weyman Croon FNP student who assisted with exam   Impression and Paln: 1. Encounter for gynecological examination with Papanicolaou smear of cervix Pap sent Physical in 1 year Pap in 3 if normal Get mammogram, number given for [facility] with PCP  2. Screening for colorectal cancer

## 2019-03-26 LAB — CYTOLOGY - PAP
Adequacy: ABSENT
Chlamydia: NEGATIVE
Comment: NEGATIVE
Comment: NEGATIVE
Comment: NORMAL
Diagnosis: NEGATIVE
High risk HPV: NEGATIVE
Neisseria Gonorrhea: NEGATIVE

## 2019-04-15 ENCOUNTER — Other Ambulatory Visit (HOSPITAL_COMMUNITY): Payer: Self-pay | Admitting: Internal Medicine

## 2019-04-15 DIAGNOSIS — Z1231 Encounter for screening mammogram for malignant neoplasm of breast: Secondary | ICD-10-CM

## 2019-05-09 ENCOUNTER — Other Ambulatory Visit: Payer: Self-pay

## 2019-05-09 ENCOUNTER — Ambulatory Visit (HOSPITAL_COMMUNITY)
Admission: RE | Admit: 2019-05-09 | Discharge: 2019-05-09 | Disposition: A | Discharge status: Home / Self Care | Payer: Medicaid Other | Source: Ambulatory Visit | Attending: Internal Medicine | Admitting: Internal Medicine

## 2019-05-09 DIAGNOSIS — Z1231 Encounter for screening mammogram for malignant neoplasm of breast: Secondary | ICD-10-CM | POA: Insufficient documentation

## 2019-06-10 ENCOUNTER — Ambulatory Visit: Payer: Medicaid Other | Attending: Internal Medicine

## 2019-06-10 ENCOUNTER — Other Ambulatory Visit: Payer: Self-pay

## 2019-06-10 DIAGNOSIS — Z20822 Contact with and (suspected) exposure to covid-19: Secondary | ICD-10-CM

## 2019-06-11 LAB — NOVEL CORONAVIRUS, NAA: SARS-CoV-2, NAA: NOT DETECTED

## 2019-07-11 ENCOUNTER — Encounter (HOSPITAL_COMMUNITY): Payer: Self-pay

## 2019-07-11 ENCOUNTER — Other Ambulatory Visit: Payer: Self-pay

## 2019-07-11 ENCOUNTER — Emergency Department (HOSPITAL_COMMUNITY)
Admission: EM | Admit: 2019-07-11 | Discharge: 2019-07-12 | Disposition: A | Discharge status: Other Acute Inpatient Hospital | Payer: Medicaid Other | Attending: Emergency Medicine | Admitting: Emergency Medicine

## 2019-07-11 DIAGNOSIS — Z20822 Contact with and (suspected) exposure to covid-19: Secondary | ICD-10-CM | POA: Insufficient documentation

## 2019-07-11 DIAGNOSIS — R443 Hallucinations, unspecified: Secondary | ICD-10-CM

## 2019-07-11 DIAGNOSIS — Z79899 Other long term (current) drug therapy: Secondary | ICD-10-CM | POA: Insufficient documentation

## 2019-07-11 DIAGNOSIS — F329 Major depressive disorder, single episode, unspecified: Secondary | ICD-10-CM | POA: Diagnosis not present

## 2019-07-11 DIAGNOSIS — F250 Schizoaffective disorder, bipolar type: Secondary | ICD-10-CM | POA: Diagnosis not present

## 2019-07-11 DIAGNOSIS — R45851 Suicidal ideations: Secondary | ICD-10-CM | POA: Insufficient documentation

## 2019-07-11 DIAGNOSIS — R440 Auditory hallucinations: Secondary | ICD-10-CM | POA: Diagnosis present

## 2019-07-11 DIAGNOSIS — R45850 Homicidal ideations: Secondary | ICD-10-CM | POA: Diagnosis not present

## 2019-07-11 HISTORY — DX: Schizoaffective disorder, bipolar type: F25.0

## 2019-07-11 LAB — CBC
HCT: 41.3 % (ref 36.0–46.0)
Hemoglobin: 13 g/dL (ref 12.0–15.0)
MCH: 28.6 pg (ref 26.0–34.0)
MCHC: 31.5 g/dL (ref 30.0–36.0)
MCV: 90.8 fL (ref 80.0–100.0)
Platelets: 228 10*3/uL (ref 150–400)
RBC: 4.55 MIL/uL (ref 3.87–5.11)
RDW: 13.2 % (ref 11.5–15.5)
WBC: 7.2 10*3/uL (ref 4.0–10.5)
nRBC: 0 % (ref 0.0–0.2)

## 2019-07-11 LAB — COMPREHENSIVE METABOLIC PANEL
ALT: 16 U/L (ref 0–44)
AST: 20 U/L (ref 15–41)
Albumin: 3.7 g/dL (ref 3.5–5.0)
Alkaline Phosphatase: 55 U/L (ref 38–126)
Anion gap: 8 (ref 5–15)
BUN: 8 mg/dL (ref 6–20)
CO2: 25 mmol/L (ref 22–32)
Calcium: 8.8 mg/dL — ABNORMAL LOW (ref 8.9–10.3)
Chloride: 103 mmol/L (ref 98–111)
Creatinine, Ser: 0.67 mg/dL (ref 0.44–1.00)
GFR calc Af Amer: >60 mL/min (ref >60)
GFR calc non Af Amer: >60 mL/min (ref >60)
Glucose, Bld: 81 mg/dL (ref 70–99)
Potassium: 3.8 mmol/L (ref 3.5–5.1)
Sodium: 136 mmol/L (ref 135–145)
Total Bilirubin: 0.6 mg/dL (ref 0.3–1.2)
Total Protein: 7.1 g/dL (ref 6.5–8.1)

## 2019-07-11 LAB — RAPID URINE DRUG SCREEN, HOSP PERFORMED
Amphetamines: NOT DETECTED
Barbiturates: NOT DETECTED
Benzodiazepines: NOT DETECTED
Cocaine: NOT DETECTED
Opiates: NOT DETECTED
Tetrahydrocannabinol: NOT DETECTED

## 2019-07-11 LAB — SALICYLATE LEVEL: Salicylate Lvl: <7 mg/dL — ABNORMAL LOW (ref 7.0–30.0)

## 2019-07-11 LAB — ACETAMINOPHEN LEVEL: Acetaminophen (Tylenol), Serum: <10 ug/mL — ABNORMAL LOW (ref 10–30)

## 2019-07-11 LAB — ETHANOL: Alcohol, Ethyl (B): <10 mg/dL (ref <10)

## 2019-07-11 LAB — POC URINE PREG, ED: Preg Test, Ur: NEGATIVE

## 2019-07-11 MED ORDER — BENZTROPINE MESYLATE 1 MG PO TABS
1.0000 mg | ORAL_TABLET | Freq: Every day | ORAL | Status: DC
  Administered 2019-07-11: 1 mg via ORAL
  Filled 2019-07-11: qty 1

## 2019-07-11 MED ORDER — OLANZAPINE 5 MG PO TABS
30.0000 mg | ORAL_TABLET | Freq: Every day | ORAL | Status: DC
  Administered 2019-07-11: 22:00:00 30 mg via ORAL
  Filled 2019-07-11: qty 6

## 2019-07-11 MED ORDER — DOCUSATE SODIUM 100 MG PO CAPS
100.0000 mg | ORAL_CAPSULE | Freq: Every day | ORAL | Status: DC
  Administered 2019-07-11: 22:00:00 100 mg via ORAL
  Filled 2019-07-11: qty 1

## 2019-07-11 MED ORDER — SERTRALINE HCL 50 MG PO TABS
150.0000 mg | ORAL_TABLET | Freq: Every day | ORAL | Status: DC
  Filled 2019-07-11: qty 3

## 2019-07-11 MED ORDER — DIVALPROEX SODIUM ER 500 MG PO TB24
1000.0000 mg | ORAL_TABLET | Freq: Every day | ORAL | Status: DC
  Administered 2019-07-11: 1000 mg via ORAL
  Filled 2019-07-11: qty 2

## 2019-07-11 MED ORDER — PANTOPRAZOLE SODIUM 40 MG PO TBEC
40.0000 mg | DELAYED_RELEASE_TABLET | Freq: Every day | ORAL | Status: DC
  Filled 2019-07-11: qty 1

## 2019-07-11 MED ORDER — BUSPIRONE HCL 5 MG PO TABS
15.0000 mg | ORAL_TABLET | Freq: Two times a day (BID) | ORAL | Status: DC
  Administered 2019-07-11: 22:00:00 15 mg via ORAL
  Filled 2019-07-11: qty 3

## 2019-07-11 NOTE — ED Notes (Signed)
[name] [facility]) has all pts personal belongings except her shoes.
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
## 2019-07-11 NOTE — ED Notes (Signed)
Pt wanded by security prior to changing into psych scrubs.  °

## 2019-07-11 NOTE — Progress Notes (Signed)
EDP Dr. Estell Harpin, MD advised of the disposition.   Princess Bruins, MSW, LCSW Therapeutic Triage Specialist  [phone]

## 2019-07-11 NOTE — Progress Notes (Signed)
Patient meets inpatient criteria per Sherron Flemings, NP. Patient has been faxed out to the following facilities for review:   [facility]  [facility]  [facility]  CSW will continue to follow and assist with finding a bed.   Drucilla Schmidt, MSW, LCSW-A Clinical Disposition Social Worker [facility]/TTS [phone]

## 2019-07-11 NOTE — ED Notes (Signed)
Pt speaking with TTS at this time.  
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
## 2019-07-12 LAB — RESPIRATORY PANEL BY RT PCR (FLU A&B, COVID)
Influenza A by PCR: NEGATIVE
Influenza B by PCR: NEGATIVE
SARS Coronavirus 2 by RT PCR: NEGATIVE

## 2019-07-12 NOTE — ED Notes (Signed)
Print production planner for transport to [facility].  They will call back with an ETA.

## 2019-07-12 NOTE — Progress Notes (Signed)
CSW spoke with admissions at [facility]. Pt has been accepted to their facility and can arrive this AM. It is unclear who was given initial accepting information.   Wells Guiles, LCSW, LCAS Disposition CSW [facility]/TTS [phone]

## 2019-07-12 NOTE — [facility] Counselor (Signed)
TTS reassessment: Patient is alert and oriented x 4. Patient continues to endorse SI, HI, AH, and paranoia. Per notes patient accepted to [facility].

## 2019-07-12 NOTE — ED Notes (Signed)
Shoes only belongings here to be given to the Pt. Reported that her sister her other belongings.

## 2019-07-12 NOTE — ED Notes (Signed)
Patient give verbal consent to go to [facility]. Patient's sister, [name], gives verbal consent to transport. She states she has healthcare power of attorney.

## 2019-09-13 ENCOUNTER — Encounter (HOSPITAL_COMMUNITY): Payer: Self-pay | Admitting: Emergency Medicine

## 2019-09-13 ENCOUNTER — Emergency Department (HOSPITAL_COMMUNITY)
Admission: EM | Admit: 2019-09-13 | Discharge: 2019-09-14 | Disposition: A | Discharge status: Home / Self Care | Payer: Medicaid Other | Attending: Emergency Medicine | Admitting: Emergency Medicine

## 2019-09-13 ENCOUNTER — Emergency Department (HOSPITAL_COMMUNITY): Payer: Medicaid Other

## 2019-09-13 ENCOUNTER — Other Ambulatory Visit: Payer: Self-pay

## 2019-09-13 DIAGNOSIS — R0789 Other chest pain: Secondary | ICD-10-CM | POA: Diagnosis present

## 2019-09-13 DIAGNOSIS — R197 Diarrhea, unspecified: Secondary | ICD-10-CM | POA: Insufficient documentation

## 2019-09-13 DIAGNOSIS — Z87891 Personal history of nicotine dependence: Secondary | ICD-10-CM | POA: Insufficient documentation

## 2019-09-13 DIAGNOSIS — F79 Unspecified intellectual disabilities: Secondary | ICD-10-CM | POA: Diagnosis not present

## 2019-09-13 DIAGNOSIS — Z20822 Contact with and (suspected) exposure to covid-19: Secondary | ICD-10-CM | POA: Diagnosis not present

## 2019-09-13 DIAGNOSIS — Z79899 Other long term (current) drug therapy: Secondary | ICD-10-CM | POA: Diagnosis not present

## 2019-09-13 DIAGNOSIS — Z886 Allergy status to analgesic agent status: Secondary | ICD-10-CM | POA: Insufficient documentation

## 2019-09-13 LAB — CBC
HCT: 41.8 % (ref 36.0–46.0)
Hemoglobin: 13.1 g/dL (ref 12.0–15.0)
MCH: 28.4 pg (ref 26.0–34.0)
MCHC: 31.3 g/dL (ref 30.0–36.0)
MCV: 90.7 fL (ref 80.0–100.0)
Platelets: 260 10*3/uL (ref 150–400)
RBC: 4.61 MIL/uL (ref 3.87–5.11)
RDW: 13.2 % (ref 11.5–15.5)
WBC: 8 10*3/uL (ref 4.0–10.5)
nRBC: 0 % (ref 0.0–0.2)

## 2019-09-13 LAB — HEPATIC FUNCTION PANEL
ALT: 11 U/L (ref 0–44)
AST: 13 U/L — ABNORMAL LOW (ref 15–41)
Albumin: 3.4 g/dL — ABNORMAL LOW (ref 3.5–5.0)
Alkaline Phosphatase: 53 U/L (ref 38–126)
Bilirubin, Direct: <0.1 mg/dL (ref 0.0–0.2)
Total Bilirubin: 0.4 mg/dL (ref 0.3–1.2)
Total Protein: 6.9 g/dL (ref 6.5–8.1)

## 2019-09-13 LAB — BASIC METABOLIC PANEL
Anion gap: 8 (ref 5–15)
BUN: 8 mg/dL (ref 6–20)
CO2: 26 mmol/L (ref 22–32)
Calcium: 9 mg/dL (ref 8.9–10.3)
Chloride: 104 mmol/L (ref 98–111)
Creatinine, Ser: 0.59 mg/dL (ref 0.44–1.00)
GFR calc Af Amer: >60 mL/min (ref >60)
GFR calc non Af Amer: >60 mL/min (ref >60)
Glucose, Bld: 112 mg/dL — ABNORMAL HIGH (ref 70–99)
Potassium: 4 mmol/L (ref 3.5–5.1)
Sodium: 138 mmol/L (ref 135–145)

## 2019-09-13 LAB — POC SARS CORONAVIRUS 2 AG -  ED: SARS Coronavirus 2 Ag: NEGATIVE

## 2019-09-13 LAB — LIPASE, BLOOD: Lipase: 36 U/L (ref 11–51)

## 2019-09-13 LAB — TROPONIN I (HIGH SENSITIVITY)
Troponin I (High Sensitivity): <2 ng/L (ref <18)
Troponin I (High Sensitivity): 2 ng/L (ref <18)

## 2019-09-13 LAB — HCG, SERUM, QUALITATIVE: Preg, Serum: NEGATIVE

## 2019-09-13 LAB — CK: Total CK: 35 U/L — ABNORMAL LOW (ref 38–234)

## 2019-09-13 NOTE — ED Notes (Signed)
Bedside commode placed in room. Pt aware we need to collect a stool sample.

## 2019-09-13 NOTE — ED Triage Notes (Signed)
Patient brought in by EMS from [facility] for chest pain that started yesterday. Chest pain is off and on. Patient is allergic to aspirin  Patient also states episodes of diarrhea x 1 week. Patient states that chest pain is in the center of her chest currently and rates her pain at a 7.

## 2019-09-13 NOTE — ED Provider Notes (Signed)
[facility] EMERGENCY DEPARTMENT Provider Note   CSN: [id number] Arrival date & time: 09/13/19  1938     History Chief Complaint  Patient presents with  . Chest Pain    [name] is a 54 y.o. female with a past medical history of schizoaffective bipolar type, intellectual disability, who presents today for evaluation of chest pain.  Patient reports that she has had chest pain intermittently over the past few days however only told someone today.  She reports the pain is in the center of her chest on both sides.  She denies radiation.  She reports that over the past 3 weeks she has also been having diarrhea.  She states that she is now having to wear adult diapers.  She states that she does not really noticed that she has a bowel movement until she smells it.  She denies any significant diet changes. She lives in a adult care home at [facility].   Per paperwork legal guardian is [name] [phone].   Level 5 caveat applies for intellectual disability.  Supervisor at her long-term care center reports that she has actually been wearing a diaper since she came to them and February.  She first mentioned diarrhea a week ago.    HPI    Past Medical History:  Diagnosis Date  . Depression   . Fatty liver   . Schizoaffective disorder, bipolar type (HCC)   . Seizures [facility])     Patient Active Problem List   Diagnosis Date Noted  . Screening for colorectal cancer 03/22/2019  . Encounter for gynecological examination with Papanicolaou smear of cervix 03/22/2019  . Vaginal pain 12/20/2018  . Epidermal cyst of vulva 12/20/2018  . ARM PAIN, LEFT 03/10/2008  . CARPAL TUNNEL SYNDROME 12/12/2007  . PAIN IN JOINT, UPPER ARM 12/12/2007  . LATERAL EPICONDYLITIS 12/12/2007    Past Surgical History:  Procedure Laterality Date  . TUBAL LIGATION       OB History    Gravida  0   Para  0   Term  0   Preterm  0   AB  0   Living  0     SAB  0   TAB    0   Ectopic  0   Multiple  0   Live Births  0           Family History  Problem Relation Age of Onset  . Cancer Maternal Grandmother   . Cancer Father        lung  . COPD Father   . Breast cancer Mother   . Other Paternal Grandfather        died after blood transfusion  . Cancer Maternal Grandfather        lung    Social History   Tobacco Use  . Smoking status: Former Smoker    Types: Cigarettes  . Smokeless tobacco: Never Used  Substance Use Topics  . Alcohol use: No  . Drug use: No    Home Medications Prior to Admission medications   Medication Sig Start Date End Date Taking? Authorizing Provider  alum & mag hydroxide-simeth (GERI-LANTA) 200-200-20 MG/5ML suspension Take 30 mLs by mouth 2 (two) times daily as needed for indigestion or heartburn.   Yes [provider]  benztropine (COGENTIN) 1 MG tablet Take 1 mg by mouth 2 (two) times daily.    Yes [provider]  busPIRone (BUSPAR) 15 MG tablet Take  15 mg by mouth 2 (two) times daily.   Yes [provider]  divalproex (DEPAKOTE ER) 500 MG 24 hr tablet Take 500 mg by mouth in the morning and at bedtime.    Yes [provider]  docusate sodium (STOOL SOFTENER) 100 MG capsule Take 100 mg by mouth at bedtime.    Yes [provider]  fluPHENAZine (PROLIXIN) 5 MG tablet Take 5 mg by mouth 3 (three) times daily. 08/19/19  Yes [provider]  naproxen sodium (ALEVE) 220 MG tablet Take 220 mg by mouth every 8 (eight) hours as needed (for pain).   Yes [provider]  OLANZapine (ZYPREXA) 20 MG tablet Take 20 mg by mouth in the morning.   Yes [provider]  sertraline (ZOLOFT) 100 MG tablet Take 100 mg by mouth every morning.    Yes [provider]  traZODone (DESYREL) 50 MG tablet Take 50 mg by mouth at bedtime.   Yes [provider]    Allergies    Aspirin and Nabumetone  Review of Systems   Review of Systems Level 5  caveat intellectual disability   Physical Exam Updated Vital Signs BP 128/76   Pulse 64   Temp 98 F (36.7 C) (Oral)   Resp 13   Ht 5\' 4"  (1.626 m)   Wt 111.1 kg   SpO2 96%   BMI 42.05 kg/m   Physical Exam Vitals and nursing note reviewed.  Constitutional:      General: She is not in acute distress.    Appearance: She is well-developed.  HENT:     Head: Normocephalic and atraumatic.  Eyes:     Conjunctiva/sclera: Conjunctivae normal.  Cardiovascular:     Rate and Rhythm: Normal rate and regular rhythm.     Heart sounds: Normal heart sounds. No murmur.  Pulmonary:     Effort: Pulmonary effort is normal. No accessory muscle usage or respiratory distress.     Breath sounds: Normal breath sounds.  Abdominal:     Palpations: Abdomen is soft.     Tenderness: There is no abdominal tenderness.  Musculoskeletal:     Cervical back: Normal range of motion and neck supple.     Right lower leg: No edema.     Left lower leg: No edema.     Comments: Patient reports pain in her bilateral arms and legs equally whenever I press.  This pain is also present in the chest and abdomen.  Everywhere I palpate body wide patient reports pain.  Skin:    General: Skin is warm and dry.  Neurological:     Mental Status: She is alert.     ED Results / Procedures / Treatments   Labs (all labs ordered are listed, but only abnormal results are displayed) Labs Reviewed  BASIC METABOLIC PANEL - Abnormal; Notable for the following components:      Result Value   Glucose, Bld 112 (*)    All other components within normal limits  HEPATIC FUNCTION PANEL - Abnormal; Notable for the following components:   Albumin 3.4 (*)    AST 13 (*)    All other components within normal limits  CK - Abnormal; Notable for the following components:   Total CK 35 (*)    All other components within normal limits  CBC  LIPASE, BLOOD  HCG, SERUM, QUALITATIVE  POC SARS CORONAVIRUS 2 AG -  ED  TROPONIN I (HIGH  SENSITIVITY)  TROPONIN I (HIGH SENSITIVITY)  Delta trop  was obtained at 1 hour 50 minutes rather than a full 2 hours.  Given that her pain has been ongoing for  Multiple hours no indication for third trop.   EKG EKG Interpretation  Date/Time:  Friday September 13 2019 19:46:41 EDT Ventricular Rate:  82 PR Interval:    QRS Duration: 72 QT Interval:  356 QTC Calculation: 416 R Axis:   83 Text Interpretation: Sinus rhythm Low voltage, precordial leads Borderline T abnormalities, anterior leads No significant change since last tracing Confirmed by Dorie Rank [phone]) on 09/13/2019 7:56:41 PM   Radiology DG Chest Port 1 View  Result Date: 09/13/2019 CLINICAL DATA:  Intermittent chest pain since yesterday, diarrhea EXAM: PORTABLE CHEST 1 VIEW COMPARISON:  None. FINDINGS: The heart size and mediastinal contours are within normal limits. Both lungs are clear. The visualized skeletal structures are unremarkable. IMPRESSION: No active disease. Electronically Signed   By: Randa Ngo M.D.   On: 09/13/2019 20:32    Procedures Procedures (including critical care time)  Medications Ordered in ED Medications - No data to display  ED Course  I have reviewed the triage vital signs and the nursing notes.  Pertinent labs & imaging results that were available during my care of the patient were reviewed by me and considered in my medical decision making (see chart for details).  Clinical Course as of Sep 12 2356  Fri Sep 13, 2019  2026 April, supervisor at [facility] reports that she has been wearing the diaper since she came to them in February.  She first mentioned diarrhea a week ago.  April states she is "not really sure" how reliable patient is with history.  No new medication changes.     [EH]    Clinical Course User Index [EH] Ollen Gross   MDM Rules/Calculators/A&P HEAR Score: 4                   Patient is a 54 year old woman who presents today for evaluation of multiple  complaints. She reports pain in her chest, abdomen, and entire body, in addition to diarrhea. CBC without anemia, leukocytosis or other acute abnormalities.  BMP and hepatic function without acute significant derangements.  Pregnancy test is elevated.  Lipase is not elevated.  CK is not significantly elevated. Her delta troponin was obtained 10 minutes early, however given that she has had pain for over 6 hours, do not need a third troponin especially as her chest pain is easily reproducible on physical exam.  There is conflicting information for how long she has been having diarrhea, however given that she does not have a significant leukocytosis with reassuring labs and no diarrhea while in the emergency room do not suspect she needs additional evaluation.  Her pain appears to be body wide.  Covid antigen test is negative and CK is not elevated.  Given that her pain in her abdomen is equivalent to the pain in her arms, legs on the rest of her body no indication for CT scan or additional evaluation at this time.  Patient requests food and drink multiple times while in the ER and is overall well-appearing.  This patient was seen as a shared visit with Dr. Tomi Bamberger.  Return precautions were discussed with patient who states their understanding.  At the time of discharge patient denied any unaddressed complaints or concerns.  Patient is agreeable for discharge home.  Note: Portions of this report may have been transcribed using voice recognition software. Every effort was made  to ensure accuracy; however, inadvertent computerized transcription errors may be present   Final Clinical Impression(s) / ED Diagnoses Final diagnoses:  Atypical chest pain    Rx / DC Orders ED Discharge Orders    None       Norman Clay 09/14/19 0007    Linwood Dibbles, MD 09/14/19 [phone]

## 2019-09-13 NOTE — ED Notes (Signed)
Radiology at bedside

## 2019-12-04 ENCOUNTER — Emergency Department (HOSPITAL_COMMUNITY)
Admission: EM | Admit: 2019-12-04 | Discharge: 2019-12-05 | Disposition: A | Discharge status: Home / Self Care | Payer: Medicaid Other | Attending: Emergency Medicine | Admitting: Emergency Medicine

## 2019-12-04 ENCOUNTER — Emergency Department (HOSPITAL_COMMUNITY): Payer: Medicaid Other

## 2019-12-04 ENCOUNTER — Other Ambulatory Visit: Payer: Self-pay

## 2019-12-04 ENCOUNTER — Encounter (HOSPITAL_COMMUNITY): Payer: Self-pay | Admitting: *Deleted

## 2019-12-04 DIAGNOSIS — Z79899 Other long term (current) drug therapy: Secondary | ICD-10-CM | POA: Insufficient documentation

## 2019-12-04 DIAGNOSIS — Z886 Allergy status to analgesic agent status: Secondary | ICD-10-CM | POA: Insufficient documentation

## 2019-12-04 DIAGNOSIS — R7989 Other specified abnormal findings of blood chemistry: Secondary | ICD-10-CM | POA: Insufficient documentation

## 2019-12-04 DIAGNOSIS — Z87891 Personal history of nicotine dependence: Secondary | ICD-10-CM | POA: Diagnosis not present

## 2019-12-04 DIAGNOSIS — R42 Dizziness and giddiness: Secondary | ICD-10-CM | POA: Insufficient documentation

## 2019-12-04 DIAGNOSIS — R0789 Other chest pain: Secondary | ICD-10-CM

## 2019-12-04 LAB — URINALYSIS, ROUTINE W REFLEX MICROSCOPIC
Bilirubin Urine: NEGATIVE
Glucose, UA: NEGATIVE mg/dL
Hgb urine dipstick: NEGATIVE
Ketones, ur: NEGATIVE mg/dL
Leukocytes,Ua: NEGATIVE
Nitrite: NEGATIVE
Protein, ur: NEGATIVE mg/dL
Specific Gravity, Urine: 1.004 — ABNORMAL LOW (ref 1.005–1.030)
pH: 6 (ref 5.0–8.0)

## 2019-12-04 LAB — CBC WITH DIFFERENTIAL/PLATELET
Abs Immature Granulocytes: 0.02 10*3/uL (ref 0.00–0.07)
Basophils Absolute: 0 10*3/uL (ref 0.0–0.1)
Basophils Relative: 0 %
Eosinophils Absolute: 0.1 10*3/uL (ref 0.0–0.5)
Eosinophils Relative: 2 %
HCT: 37.4 % (ref 36.0–46.0)
Hemoglobin: 12 g/dL (ref 12.0–15.0)
Immature Granulocytes: 0 %
Lymphocytes Relative: 35 %
Lymphs Abs: 2.7 10*3/uL (ref 0.7–4.0)
MCH: 28.9 pg (ref 26.0–34.0)
MCHC: 32.1 g/dL (ref 30.0–36.0)
MCV: 90.1 fL (ref 80.0–100.0)
Monocytes Absolute: 0.9 10*3/uL (ref 0.1–1.0)
Monocytes Relative: 12 %
Neutro Abs: 3.9 10*3/uL (ref 1.7–7.7)
Neutrophils Relative %: 51 %
Platelets: 241 10*3/uL (ref 150–400)
RBC: 4.15 MIL/uL (ref 3.87–5.11)
RDW: 13.2 % (ref 11.5–15.5)
WBC: 7.7 10*3/uL (ref 4.0–10.5)
nRBC: 0 % (ref 0.0–0.2)

## 2019-12-04 LAB — BASIC METABOLIC PANEL
Anion gap: 8 (ref 5–15)
BUN: 5 mg/dL — ABNORMAL LOW (ref 6–20)
CO2: 26 mmol/L (ref 22–32)
Calcium: 8.5 mg/dL — ABNORMAL LOW (ref 8.9–10.3)
Chloride: 96 mmol/L — ABNORMAL LOW (ref 98–111)
Creatinine, Ser: 0.58 mg/dL (ref 0.44–1.00)
GFR calc Af Amer: >60 mL/min (ref >60)
GFR calc non Af Amer: >60 mL/min (ref >60)
Glucose, Bld: 100 mg/dL — ABNORMAL HIGH (ref 70–99)
Potassium: 4 mmol/L (ref 3.5–5.1)
Sodium: 130 mmol/L — ABNORMAL LOW (ref 135–145)

## 2019-12-04 LAB — HEPATIC FUNCTION PANEL
ALT: 15 U/L (ref 0–44)
AST: 18 U/L (ref 15–41)
Albumin: 3.9 g/dL (ref 3.5–5.0)
Alkaline Phosphatase: 46 U/L (ref 38–126)
Bilirubin, Direct: <0.1 mg/dL (ref 0.0–0.2)
Total Bilirubin: 0.2 mg/dL — ABNORMAL LOW (ref 0.3–1.2)
Total Protein: 6.9 g/dL (ref 6.5–8.1)

## 2019-12-04 LAB — TROPONIN I (HIGH SENSITIVITY)
Troponin I (High Sensitivity): <2 ng/L (ref <18)
Troponin I (High Sensitivity): <2 ng/L (ref <18)

## 2019-12-04 LAB — LIPASE, BLOOD: Lipase: 31 U/L (ref 11–51)

## 2019-12-04 LAB — PREGNANCY, URINE: Preg Test, Ur: NEGATIVE

## 2019-12-04 MED ORDER — ONDANSETRON 4 MG PO TBDP: 4.0000 mg | ORAL_TABLET | Freq: Three times a day (TID) | ORAL | 0 refills | Status: DC | PRN

## 2019-12-04 MED ORDER — ACETAMINOPHEN 325 MG PO TABS
650.0000 mg | ORAL_TABLET | Freq: Once | ORAL | Status: AC
  Administered 2019-12-04: 650 mg via ORAL
  Filled 2019-12-04: qty 2

## 2019-12-04 NOTE — Discharge Instructions (Addendum)
Your lab tests x-rays and EKG are reassuring tonight as is your exam.  Make sure you are drinking plenty of fluids.  You may use the Zofran if needed for any return of your nausea symptoms.  Plan to see your primary MD for recheck of your symptoms if they persist or worsen.

## 2019-12-04 NOTE — ED Provider Notes (Addendum)
[facility] EMERGENCY DEPARTMENT Provider Note   CSN: [id number] Arrival date & time: 12/04/19  1421     History Chief Complaint  Patient presents with  . Dizziness    [name] is a 54 y.o. female, resident at a local nursing home, with a history of depression, schizoaffective disorder and a history of seizure disorder as a child, presenting with a 2-day history of nausea, occasional emesis and feeling lightheaded for the past 2 evenings.  She also endorses episodes of sharp fleeting left-sided chest pain without radiation, sometimes occurring at rest, other times with exertion over the past several days.  She can reproduce her pain with palpation.  She has had no fevers or chills, denies shortness of breath, cough, also no abdominal pain although she does endorse some nausea, nonbloody emesis x1 today.  She has been able to tolerate p.o. intake.      The history is provided by the patient.    HPI: A 54 year old patient with a history of obesity presents for evaluation of chest pain. Initial onset of pain was more than 6 hours ago. The patient's chest pain is sharp and is not worse with exertion. The patient complains of nausea. The patient's chest pain is middle- or left-sided, is not well-localized, is not described as heaviness/pressure/tightness and does not radiate to the arms/jaw/neck. The patient denies diaphoresis. The patient has no history of stroke, has no history of peripheral artery disease, has not smoked in the past 90 days, denies any history of treated diabetes, has no relevant family history of coronary artery disease (first degree relative at less than age 70), is not hypertensive and has no history of hypercholesterolemia.   Past Medical History:  Diagnosis Date  . Depression   . Fatty liver   . Schizoaffective disorder, bipolar type (HCC)   . Seizures [facility])     Patient Active Problem List   Diagnosis Date Noted  . Screening for colorectal cancer  03/22/2019  . Encounter for gynecological examination with Papanicolaou smear of cervix 03/22/2019  . Vaginal pain 12/20/2018  . Epidermal cyst of vulva 12/20/2018  . ARM PAIN, LEFT 03/10/2008  . CARPAL TUNNEL SYNDROME 12/12/2007  . PAIN IN JOINT, UPPER ARM 12/12/2007  . LATERAL EPICONDYLITIS 12/12/2007    Past Surgical History:  Procedure Laterality Date  . TUBAL LIGATION       OB History    Gravida  0   Para  0   Term  0   Preterm  0   AB  0   Living  0     SAB  0   TAB  0   Ectopic  0   Multiple  0   Live Births  0           Family History  Problem Relation Age of Onset  . Cancer Maternal Grandmother   . Cancer Father        lung  . COPD Father   . Breast cancer Mother   . Other Paternal Grandfather        died after blood transfusion  . Cancer Maternal Grandfather        lung    Social History   Tobacco Use  . Smoking status: Former Smoker    Types: Cigarettes  . Smokeless tobacco: Never Used  Vaping Use  . Vaping Use: Never used  Substance Use Topics  . Alcohol use: No  . Drug use: No    Home Medications Prior  to Admission medications   Medication Sig Start Date End Date Taking? Authorizing Provider  Bacillus Coagulans-Inulin (PROBIOTIC FORMULA PO) Take 1 capsule by mouth daily.   Yes [provider]  benztropine (COGENTIN) 1 MG tablet Take 1 mg by mouth 2 (two) times daily.    Yes [provider]  bismuth subsalicylate (STOMACH RELIEF) 262 MG/15ML suspension Take 30 mLs by mouth 2 (two) times daily as needed for diarrhea or loose stools.   Yes [provider]  cholecalciferol (VITAMIN D3) 25 MCG (1000 UNIT) tablet Take 1,000 Units by mouth daily.   Yes [provider]  divalproex (DEPAKOTE ER) 500 MG 24 hr tablet Take 500 mg by mouth in the morning and at bedtime.    Yes [provider]  fluPHENAZine (PROLIXIN) 5 MG tablet Take 5 mg by mouth 3 (three) times daily. 08/19/19  Yes [provider]  Heat Wraps (THERMACARE BACK/HIP) MISC Apply 1 application topically See admin instructions. Applied to lower back on e daily as needed for soreness (Not to wear longer than 8 hours per 24 hours)   Yes [provider]  naproxen sodium (ALEVE) 220 MG tablet Take 220 mg by mouth every 8 (eight) hours as needed (for pain).   Yes [provider]  OLANZapine (ZYPREXA) 20 MG tablet Take 20 mg by mouth in the morning.   Yes [provider]  phenazopyridine (PYRIDIUM) 100 MG tablet Take 100 mg by mouth 3 (three) times daily as needed for pain.   Yes [provider]  sertraline (ZOLOFT) 100 MG tablet Take 100 mg by mouth every morning.    Yes [provider]  traZODone (DESYREL) 50 MG tablet Take 50 mg by mouth at bedtime.   Yes [provider]    Allergies    Aspirin and Nabumetone  Review of Systems   Review of Systems  Constitutional: Negative for chills and fever.  HENT: Negative for congestion and sore throat.   Eyes: Negative.   Respiratory: Negative for chest tightness and shortness of breath.   Cardiovascular: Positive for chest pain.  Gastrointestinal: Positive for nausea and vomiting. Negative for abdominal pain, constipation and diarrhea.  Genitourinary: Negative.   Musculoskeletal: Negative for arthralgias, joint swelling and neck pain.  Skin: Negative.  Negative for rash and wound.  Neurological: Positive for light-headedness. Negative for dizziness, weakness, numbness and headaches.  Psychiatric/Behavioral: Negative.     Physical Exam Updated Vital Signs BP 111/68   Pulse (!) 56   Temp 97.9 F (36.6 C) (Oral)   Resp 13   SpO2 97%   Physical Exam Vitals and nursing note reviewed.  Constitutional:      Appearance: She is well-developed.  HENT:     Head: Normocephalic and atraumatic.     Mouth/Throat:     Mouth: Mucous membranes are moist.  Eyes:     Conjunctiva/sclera: Conjunctivae normal.    Cardiovascular:     Rate and Rhythm: Normal rate and regular rhythm.     Heart sounds: Normal heart sounds.  Pulmonary:     Effort: Pulmonary effort is normal. No respiratory distress.     Breath sounds: Normal breath sounds. No wheezing or rhonchi.     Comments: Her pain is reproducible with palpation at her left upper sternal border.  There is no crepitus, edema or bruising noted at the site. Chest:     Chest wall: Tenderness present.  Abdominal:     General: Abdomen is protuberant. Bowel sounds are  normal.     Palpations: Abdomen is soft. There is no mass.     Tenderness: There is no abdominal tenderness. There is no guarding or rebound.  Musculoskeletal:        General: Normal range of motion.     Cervical back: Normal range of motion.  Skin:    General: Skin is warm and dry.  Neurological:     Mental Status: She is alert.    Orthostatic Lying   BP- Lying: 112/66  Pulse- Lying: 57      Orthostatic Sitting  BP- Sitting: 132/72  Pulse- Sitting: 66      Orthostatic Standing at 0 minutes  BP- Standing at 0 minutes: 106/66  Pulse- Standing at 0 minutes: 62    ED Results / Procedures / Treatments   Labs (all labs ordered are listed, but only abnormal results are displayed) Labs Reviewed  BASIC METABOLIC PANEL - Abnormal; Notable for the following components:      Result Value   Sodium 130 (*)    Chloride 96 (*)    Glucose, Bld 100 (*)    BUN 5 (*)    Calcium 8.5 (*)    All other components within normal limits  URINALYSIS, ROUTINE W REFLEX MICROSCOPIC - Abnormal; Notable for the following components:   Specific Gravity, Urine 1.004 (*)    All other components within normal limits  HEPATIC FUNCTION PANEL - Abnormal; Notable for the following components:   Total Bilirubin 0.2 (*)    All other components within normal limits  CBC WITH DIFFERENTIAL/PLATELET  PREGNANCY, URINE  LIPASE, BLOOD  TROPONIN I (HIGH SENSITIVITY)  TROPONIN I (HIGH SENSITIVITY)     EKG EKG Interpretation  Date/Time:  Wednesday December 04 2019 17:16:00 EDT Ventricular Rate:  55 PR Interval:  156 QRS Duration: 93 QT Interval:  435 QTC Calculation: 416 R Axis:   37 Text Interpretation: Sinus rhythm Low voltage, precordial leads No acute changes Confirmed by Meridee Score [phone]) on 12/04/2019 5:22:36 PM   Radiology DG Chest Port 1 View  Result Date: 12/04/2019 CLINICAL DATA:  Chest pain EXAM: PORTABLE CHEST 1 VIEW COMPARISON:  None. FINDINGS: The heart size and mediastinal contours are within normal limits. Both lungs are clear. The visualized skeletal structures are unremarkable. IMPRESSION: No active disease. Electronically Signed   By: Deatra Robinson M.D.   On: 12/04/2019 19:10    Procedures Procedures (including critical care time)  Medications Ordered in ED Medications - No data to display  ED Course  I have reviewed the triage vital signs and the nursing notes.  Pertinent labs & imaging results that were available during my care of the patient were reviewed by me and considered in my medical decision making (see chart for details).    MDM Rules/Calculators/A&P HEAR Score: 2                        Patient with a 2-day history of intermittent fleeting left-sided chest pain with a heart score of 2.  Delta troponins are negative.  Her vital signs are stable including a normal blood pressure, she is not tachycardic, in fact her pulse rate is borderline low at 56.  No indication to suggest pulmonary embolism, especially given fleeting and intermittent episodes of symptoms and no shortness of breath.  She also endorses lightheadedness, it was not positional this evening when obtaining her orthostatic vital signs.  She did have a drop in her blood pressure from sitting  to standing, but her pulse rate remained at the same.  She had no symptoms with these positional changes.  Labs and imaging and EKG were reviewed and discussed with patient.  Her labs are stable  today.  Her urinalysis is normal, has a low specific gravity, suggesting she is not dehydrated.  She had 2 troponins both less than 2, hepatic function panel, lipase all normal results.  Patient is not pregnant.  Patient was prescribed Zofran to help with nausea, advised increase fluid intake, plan follow-up with her PCP for recheck of symptoms persist.  The patient appears reasonably screened and/or stabilized for discharge and I doubt any other medical condition or other [facility] requiring further screening, evaluation, or treatment in the ED at this time prior to discharge.    Burgess Amor, Cordelia Poche 12/04/19 2127  Final Clinical Impression(s) / ED Diagnoses Final diagnoses:  Atypical chest pain  Lightheadedness    Rx / DC Orders ED Discharge Orders    None       Victoriano Lain 12/04/19 2333    Terrilee Files, MD 12/05/19 1119

## 2019-12-04 NOTE — ED Triage Notes (Signed)
Pt c/o dizziness for past two nights.  Emesis at times, denies today. C/o light headedness at present. Denies pain at present.

## 2021-10-21 ENCOUNTER — Emergency Department (HOSPITAL_COMMUNITY): Payer: Medicaid Other

## 2021-10-21 ENCOUNTER — Encounter (HOSPITAL_COMMUNITY): Payer: Self-pay | Admitting: Emergency Medicine

## 2021-10-21 ENCOUNTER — Emergency Department (HOSPITAL_COMMUNITY)
Admission: EM | Admit: 2021-10-21 | Discharge: 2021-10-22 | Disposition: A | Discharge status: Skilled Nursing Facility | Payer: Medicaid Other | Attending: Emergency Medicine | Admitting: Emergency Medicine

## 2021-10-21 DIAGNOSIS — S0990XA Unspecified injury of head, initial encounter: Secondary | ICD-10-CM | POA: Diagnosis present

## 2021-10-21 DIAGNOSIS — S0003XA Contusion of scalp, initial encounter: Secondary | ICD-10-CM | POA: Insufficient documentation

## 2021-10-21 DIAGNOSIS — W01198A Fall on same level from slipping, tripping and stumbling with subsequent striking against other object, initial encounter: Secondary | ICD-10-CM | POA: Insufficient documentation

## 2021-10-21 DIAGNOSIS — W19XXXA Unspecified fall, initial encounter: Secondary | ICD-10-CM

## 2021-10-21 DIAGNOSIS — M25511 Pain in right shoulder: Secondary | ICD-10-CM | POA: Insufficient documentation

## 2021-10-21 MED ORDER — HYDROCODONE-ACETAMINOPHEN 5-325 MG PO TABS
1.0000 | ORAL_TABLET | Freq: Once | ORAL | Status: AC
  Administered 2021-10-22: 1 via ORAL
  Filled 2021-10-21: qty 1

## 2021-10-21 NOTE — ED Notes (Signed)
Patient transported to X-ray 

## 2021-10-21 NOTE — ED Notes (Signed)
Pt pulled themself up in bed unassisted and walked to and from the bathroom unassisted

## 2021-10-21 NOTE — ED Triage Notes (Signed)
Pt had mechanical fall tonight at [facility]. Pt c/o right shoulder pain. No deformity noted. NAD.

## 2021-10-21 NOTE — ED Provider Notes (Signed)
[facility] EMERGENCY DEPARTMENT Provider Note   CSN: [id number] Arrival date & time: 10/21/21  2113     History  Chief Complaint  Patient presents with   Marletta Lor    [name] is a 56 y.o. female.  Patient presents to the emergency department for evaluation of right shoulder pain after a fall.  Patient reports a mechanical fall.  She was ambulating and her foot slipped, causing her to fall onto her right side.  She reports that she hurts in lots of above areas but mostly is the right shoulder.  She is able to move it.  She did hit her head but no loss of consciousness.  No neck pain.      Home Medications Prior to Admission medications   Medication Sig Start Date End Date Taking? Authorizing Provider  Bacillus Coagulans-Inulin (PROBIOTIC FORMULA PO) Take 1 capsule by mouth daily.    [provider]  benztropine (COGENTIN) 1 MG tablet Take 1 mg by mouth 2 (two) times daily.     [provider]  bismuth subsalicylate (STOMACH RELIEF) 262 MG/15ML suspension Take 30 mLs by mouth 2 (two) times daily as needed for diarrhea or loose stools.    [provider]  cholecalciferol (VITAMIN D3) 25 MCG (1000 UNIT) tablet Take 1,000 Units by mouth daily.    [provider]  divalproex (DEPAKOTE ER) 500 MG 24 hr tablet Take 500 mg by mouth in the morning and at bedtime.     [provider]  fluPHENAZine (PROLIXIN) 5 MG tablet Take 5 mg by mouth 3 (three) times daily. 08/19/19   [provider]  Heat Wraps [facility] BACK/HIP) MISC Apply 1 application topically See admin instructions. Applied to lower back on e daily as needed for soreness (Not to wear longer than 8 hours per 24 hours)    [provider]  naproxen sodium (ALEVE) 220 MG tablet Take 220 mg by mouth every 8 (eight) hours as needed (for pain).    [provider]  OLANZapine (ZYPREXA) 20 MG tablet Take 20 mg by mouth in the morning.    [provider]   ondansetron (ZOFRAN ODT) 4 MG disintegrating tablet Take 1 tablet (4 mg total) by mouth every 8 (eight) hours as needed for nausea or vomiting. 12/04/19   Burgess Amor, PA-C  phenazopyridine (PYRIDIUM) 100 MG tablet Take 100 mg by mouth 3 (three) times daily as needed for pain.    [provider]  sertraline (ZOLOFT) 100 MG tablet Take 100 mg by mouth every morning.     [provider]  traZODone (DESYREL) 50 MG tablet Take 50 mg by mouth at bedtime.    [provider]      Allergies    Aspirin and Nabumetone    Review of Systems   Review of Systems  Physical Exam Updated Vital Signs BP 137/84 (BP Location: Left Arm)   Pulse 61   Temp 97.8 F (36.6 C)   Resp 20   Ht 5\' 4"  (1.626 m)   Wt 111.1 kg   SpO2 97%   BMI 42.04 kg/m  Physical Exam Vitals and nursing note reviewed.  Constitutional:      General: She is not in acute distress.    Appearance: She is well-developed.  HENT:     Head: Normocephalic and atraumatic.     Mouth/Throat:     Mouth: Mucous membranes are moist.  Eyes:     General: Vision grossly intact. Gaze aligned  appropriately.     Extraocular Movements: Extraocular movements intact.     Conjunctiva/sclera: Conjunctivae normal.  Cardiovascular:     Rate and Rhythm: Normal rate and regular rhythm.     Pulses: Normal pulses.     Heart sounds: Normal heart sounds, S1 normal and S2 normal. No murmur heard.   No friction rub. No gallop.  Pulmonary:     Effort: Pulmonary effort is normal. No respiratory distress.     Breath sounds: Normal breath sounds.  Abdominal:     General: Bowel sounds are normal.     Palpations: Abdomen is soft.     Tenderness: There is no abdominal tenderness. There is no guarding or rebound.     Hernia: No hernia is present.  Musculoskeletal:        General: No swelling.     Right shoulder: Tenderness present. No swelling. Normal range of motion.     Cervical back: Full passive range of motion without pain,  normal range of motion and neck supple. No spinous process tenderness or muscular tenderness. Normal range of motion.     Right lower leg: No edema.     Left lower leg: No edema.  Skin:    General: Skin is warm and dry.     Capillary Refill: Capillary refill takes less than 2 seconds.     Findings: No ecchymosis, erythema, rash or wound.  Neurological:     General: No focal deficit present.     Mental Status: She is alert and oriented to person, place, and time.     GCS: GCS eye subscore is 4. GCS verbal subscore is 5. GCS motor subscore is 6.     Cranial Nerves: Cranial nerves 2-12 are intact.     Sensory: Sensation is intact.     Motor: Motor function is intact.     Coordination: Coordination is intact.  Psychiatric:        Attention and Perception: Attention normal.        Mood and Affect: Mood normal.        Speech: Speech normal.        Behavior: Behavior normal.    ED Results / Procedures / Treatments   Labs (all labs ordered are listed, but only abnormal results are displayed) Labs Reviewed - No data to display  EKG None  Radiology DG Shoulder Right  Result Date: 10/21/2021 CLINICAL DATA:  Fall, pain. EXAM: RIGHT SHOULDER - 2+ VIEW COMPARISON:  None Available. FINDINGS: There is no evidence of fracture or dislocation. There are mild degenerative changes of the glenohumeral joint with osteophyte formation and sclerosis. Soft tissues are unremarkable. IMPRESSION: 1. No acute fracture or dislocation. 2. Mild degenerative changes. Electronically Signed   By: Darliss CheneyAmy  Guttmann M.D.   On: 10/21/2021 21:47    Procedures Procedures    Medications Ordered in ED Medications  HYDROcodone-acetaminophen (NORCO/VICODIN) 5-325 MG per tablet 1 tablet (has no administration in time range)    ED Course/ Medical Decision Making/ A&P                           Medical Decision Making Amount and/or Complexity of Data Reviewed Radiology: ordered.   Presents with right shoulder pain  after a ground-level fall.  Patient without deformity.  She has diffuse soft tissue tenderness around the shoulder but is able to move it through full range of motion.  X-ray negative.  No other definitive areas of injury.  She does report hitting her head but there was no loss of consciousness.  She is awake, alert, no neurologic deficit.  She is not on blood thinners, does not require CT scan.         Final Clinical Impression(s) / ED Diagnoses Final diagnoses:  Fall, initial encounter  Contusion of scalp, initial encounter    Rx / DC Orders ED Discharge Orders     None         Ezella Kell, Canary Brim, MD 10/21/21 2353

## 2021-10-21 NOTE — ED Notes (Signed)
Pt cc of various pain sites. Pt presented with heat packs to try. Pt agreed and has one placed on abdomen and one on her back

## 2021-10-21 NOTE — ED Notes (Signed)
ED Provider at bedside. 

## 2021-10-22 NOTE — ED Notes (Signed)
Spoke with Victorino Dike at [place],  can send transport in 30 minutes

## 2022-04-06 ENCOUNTER — Other Ambulatory Visit: Payer: Self-pay | Admitting: Internal Medicine

## 2022-04-06 DIAGNOSIS — Z1231 Encounter for screening mammogram for malignant neoplasm of breast: Secondary | ICD-10-CM

## 2022-04-20 ENCOUNTER — Ambulatory Visit
Admission: RE | Admit: 2022-04-20 | Discharge: 2022-04-20 | Disposition: A | Discharge status: Home / Self Care | Payer: Medicaid Other | Source: Ambulatory Visit | Attending: Internal Medicine | Admitting: Internal Medicine

## 2022-04-20 DIAGNOSIS — Z1231 Encounter for screening mammogram for malignant neoplasm of breast: Secondary | ICD-10-CM

## 2022-06-19 NOTE — H&P (View-Only) (Signed)
GI Office Note    Referring Provider: Monico Blitz, MD Primary Care Physician:  Monico Blitz, MD  Primary Gastroenterologist: Cristopher Estimable.Rourk, MD  Chief Complaint   Chief Complaint  Patient presents with   Abdominal Pain    Abdominal pains. Having diarrhea.    History of Present Illness   [name] is a 57 y.o. female presenting today at the request of Monico Blitz, MD for IBS.   EGD May 2007: -couple 15m gastric polyps (benign) -normal stomach -normal duodenum -Continue PPI daily  Colonoscopy May 2007: -tight anal sphincter -prep sub-optimal -anal papilla -internal hemorrhoids -450mrectal polyp -12m19molyp at 25cm -Anusol nightly advised for 10 days -start miralax nightly -colace BID  Cologuard negative in 2020.  Labs in May 2023 with elevated lipase 88, hemoglobin 11.8, platelets 282, potassium 133, albumin 3.1, normal LFTs.   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  Density in the gallbladder likely representing small gallstones without ductal dilation.  Per PCP visit note 05/04/2022 patient reported gradual, persistent abdominal pain.  Also pertinent patient was referred to Dr. CatLadona Horns September 2023 and provided with FOBT and refused colonoscopy.  Per note patient actively working on weight management with diet, exercise, and hydration.  Current med list from [facility] reports antacid suspension, famotidine, and pantoprazole recently discontinued.  Today: Patient Sister [name] with her helping to provide history as she was a primary caregiver for her prior to patient moving into a long-term care facility.  IBS, diarrhea: Gets really bad sharp pain in her lower abdomen. Was having a lot of diarrhea but it comes and goes. Will have diarrhea for 3 weeks straight and will have loose stools in her sleep and  need to wear diapers. Will have multiple loose stools per day needed lots of clean up. May be fine for 2 weeks. Used to have lots of constipation. Other antidiarrheals never helped. Has had bad odors as well. No fevers. No recent sick contacts.   Sister used to care for her and she was constantly constipated and was unable to get completely regular. Has been at [facility] for 3 years and the last year has been an on and off diarrhea battle. No melena or BRBPR. Has had hemorrhoids removed before. Sits on commode for long periods at a time. Currently having intermittent burning after Bms.   Has not had any blood drawn recently. Sometimes has burning in her throat. Sometimes gets choked with medication and will need extra water.   Taking naproxen as needed.   GERD: Had been taking antacids for years. Having upper abdominal pain after she eats and comes and goes and it is sharp. Worse in the evenings. Burps a lot after she drinks. Sister repots she is a lazy chewer and does not chew food well.    Current Outpatient Medications  Medication Sig Dispense Refill   Bacillus Coagulans-Inulin (PROBIOTIC FORMULA PO) Take 1 capsule by mouth daily.     benztropine (COGENTIN) 1 MG tablet Take 1 mg by mouth 2 (two) times daily.      bismuth subsalicylate (STOMACH RELIEF) 262 MG/15ML suspension Take 30 mLs by mouth 2 (two) times daily as needed for diarrhea or loose stools.     busPIRone (BUSPAR) 15 MG tablet Take 15 mg by mouth 2 (two) times daily.     cholecalciferol (VITAMIN D3) 25 MCG (  1000 UNIT) tablet Take 1,000 Units by mouth daily.     divalproex (DEPAKOTE ER) 500 MG 24 hr tablet Take 500 mg by mouth in the morning and at bedtime.      fluPHENAZine (PROLIXIN) 5 MG tablet Take 5 mg by mouth 3 (three) times daily.     Heat Wraps (THERMACARE BACK/HIP) MISC Apply 1 application topically See admin instructions. Applied to lower back on e daily as needed for soreness (Not to wear longer than 8 hours per 24  hours)     levocetirizine (XYZAL) 5 MG tablet Take 5 mg by mouth daily.     naproxen sodium (ALEVE) 220 MG tablet Take 220 mg by mouth every 8 (eight) hours as needed (for pain).     OLANZapine (ZYPREXA) 20 MG tablet Take 20 mg by mouth in the morning.     QC ANTACID 200-200-20 MG/5ML suspension Take by mouth.     sertraline (ZOLOFT) 100 MG tablet Take 100 mg by mouth every morning.      traZODone (DESYREL) 50 MG tablet Take 50 mg by mouth at bedtime.     ondansetron (ZOFRAN ODT) 4 MG disintegrating tablet Take 1 tablet (4 mg total) by mouth every 8 (eight) hours as needed for nausea or vomiting. (Patient not taking: Reported on 06/20/2022) 20 tablet 0   phenazopyridine (PYRIDIUM) 100 MG tablet Take 100 mg by mouth 3 (three) times daily as needed for pain. (Patient not taking: Reported on 06/20/2022)     prednisoLONE acetate (PRED FORTE) 1 % ophthalmic suspension SMARTSIG:In Eye(s) (Patient not taking: Reported on 06/20/2022)     No current facility-administered medications for this visit.    Past Medical History:  Diagnosis Date   Depression    Fatty liver    Schizoaffective disorder, bipolar type ([place])    Seizures ([place])     Past Surgical History:  Procedure Laterality Date   TUBAL LIGATION      Family History  Problem Relation Age of Onset   Cancer Maternal Grandmother    Cancer Father        lung   COPD Father    Breast cancer Mother    Other Paternal Grandfather        died after blood transfusion   Cancer Maternal Grandfather        lung    Allergies as of 06/20/2022 - Review Complete 06/20/2022  Allergen Reaction Noted   Aspirin     Nabumetone      Social History   Socioeconomic History   Marital status: Single    Spouse name: Not on file   Number of children: Not on file   Years of education: Not on file   Highest education level: Not on file  Occupational History   Not on file  Tobacco Use   Smoking status: Former    Types: Cigarettes   Smokeless  tobacco: Never  Vaping Use   Vaping Use: Never used  Substance and Sexual Activity   Alcohol use: No   Drug use: No   Sexual activity: Not Currently    Birth control/protection: Surgical    Comment: tubal  Other Topics Concern   Not on file  Social History Narrative   Not on file   Social Determinants of Health   Financial Resource Strain: Not on file  Food Insecurity: Not on file  Transportation Needs: Not on file  Physical Activity: Not on file  Stress: Not on file  Social Connections: Not on file  Intimate Partner Violence: Not on file     Review of Systems   Gen: Denies any fever, chills, fatigue, weight loss, lack of appetite.  CV: Denies chest pain, heart palpitations, peripheral edema, syncope.  Resp: Denies shortness of breath at rest or with exertion. Denies wheezing or cough.  GI: see HPI GU : Denies urinary burning, urinary frequency, urinary hesitancy MS: Denies joint pain, muscle weakness, cramps, or limitation of movement.  Derm: Denies rash, itching, dry skin Psych: Denies depression, anxiety, memory loss, and confusion Heme: Denies bruising, bleeding, and enlarged lymph nodes.   Physical Exam   BP (!) 160/66 (BP Location: Right Arm, Patient Position: Sitting, Cuff Size: Large)   Pulse 72   Temp 97.6 F (36.4 C) (Temporal)   Ht 5' 7"$  (1.702 m)   Wt 252 lb 6.4 oz (114.5 kg)   LMP  (Approximate)   SpO2 95%   BMI 39.53 kg/m   General:   Alert and oriented. Pleasant and cooperative. Well-nourished and well-developed.  Head:  Normocephalic and atraumatic. Eyes:  Without icterus, sclera clear and conjunctiva pink.  Ears:  Normal auditory acuity. Mouth: No dentition, currently without dentures. Lungs:  Clear to auscultation bilaterally. No wheezes, rales, or rhonchi. No distress.  Heart:  S1, S2 present without murmurs appreciated.  Abdomen:  +BS, soft, non-distended. Ttp to lower abdomen and epigastric region. Very mild ttp to RUQ. No HSM noted. No  guarding or rebound. No masses appreciated.  Rectal:  Deferred  Msk:  Symmetrical without gross deformities. Normal posture. Extremities:  Without edema. Neurologic:  Alert and  oriented x4;  grossly normal neurologically. Skin:  Intact without significant lesions or rashes. Psych:  Alert and cooperative. Normal mood and affect.   Assessment   [name] is a 57 y.o. female with a history of hemorrhoids s/p hemorrhoidectomy in 2006, depression, seizures, bipolar/schizoaffective disorder, fatty liver, GERD, constipation presenting today for evaluation of possible IBS.  GERD: Per sister she has been on PPI long-term as well as other antacids.  Appears her PCP intentions were to stop a couple of these medications and reduce the dose of couple days however it appears that long-term care has discontinued PPI, H2 blocker, and over-the-counter antacid altogether.  She does complain of occasional reflux of acid, occasional chest pain when laying flat at night related to reflux, and epigastric pain with or without meals, sometimes worse than others.  Also with some vague dysphagia however likely secondary to poor dentition and poor chewing capability.  Has occasional nausea symptoms as well but without vomiting. Given her tenderness today and upper abdominal pain we will restart famotidine 40 mg nightly given her symptoms are worse in the evenings and further evaluate with upper endoscopy.  Pending results may ultimately need to resume long-term low dose PPI therapy.  Abdominal pain: History of chronic abdominal pain, primarily epigastric.  Also has having some occasional lower abdominal pains.  Has history of constipation however for the last couple months has been having diarrhea as stated below.  She is tender today on exam to her epigastric region and slightly to her right upper quadrant.  Also with some mild tenderness to her lower abdomen.  Per documents from LTC it appears her antiacids including  famotidine and pantoprazole recently discontinued.  As stated above we will treat GERD with famotidine 40 mg nightly and proceed with EGD.  Given her change in bowel habits and lower abdominal pain and history of colon polyps we will evaluate with  colonoscopy as stated below.  Diarrhea, change in bowel habits: Has had several occasions of diarrhea with multiple loose stools per day lasting for about 3 weeks at a time with about a 1 to 2-week break with more normal bowel movements in between.  Also having some incontinence of diarrhea at night during the cycle.  Bowel habits are associated with abdominal pain as well and occasionally will have some relief after bowel movements.  Appears some over-the-counter antidiarrheal regimens have not been effective during this time.  Currently taking over-the-counter probiotic.  Previously with history of chronic constipation.  No formal workup done as of recently therefore we will evaluate with CBC, CMP, TSH, lipase, celiac serologies, and fecal elastase given the amount and frequency of diarrhea.  Also given history of colon polyps and his change in bowel habits we will proceed with colonoscopy for further evaluation including possible biopsies to rule out microscopic colitis.  This ultimately could be IBS however unable to rule out SIBO as well.  If all other testing negative could consider trial of Xifaxan.   History of colon polyps: Last TCS in 2007 with a couple hyperplastic polyps. No prior TCS since age 47.  Given change in bowel habits and diarrhea we will proceed with colonoscopy for further evaluation as well as surveillance.  Patient and her sister have agreed.   PLAN   Restart famotidine 40 mg nightly.  CBC, CMP, TSH, CRP, Lipase, Celiac serologies, fecal elastase Proceed with upper endoscopy and colonoscopy with propofol by Dr. Gala Romney in near future: the risks, benefits, and alternatives have been discussed with the patient in detail. The patient states  understanding and desires to proceed. ASA 2 May consider trial of Xifaxin for IBS-D vs SIBO if all other testing negative.  Follow up in 2 months.     Venetia Night, MSN, FNP-BC, AGACNP-BC [facility]

## 2022-06-19 NOTE — Progress Notes (Signed)
GI Office Note    Referring Provider: Monico Blitz, MD Primary Care Physician:  Monico Blitz, MD  Primary Gastroenterologist: Cristopher Estimable.Rourk, MD  Chief Complaint   Chief Complaint  Patient presents with   Abdominal Pain    Abdominal pains. Having diarrhea.    History of Present Illness   [name] is a 57 y.o. female presenting today at the request of Monico Blitz, MD for IBS.   EGD May 2007: -couple 15m gastric polyps (benign) -normal stomach -normal duodenum -Continue PPI daily  Colonoscopy May 2007: -tight anal sphincter -prep sub-optimal -anal papilla -internal hemorrhoids -450mrectal polyp -12m19molyp at 25cm -Anusol nightly advised for 10 days -start miralax nightly -colace BID  Cologuard negative in 2020.  Labs in May 2023 with elevated lipase 88, hemoglobin 11.8, platelets 282, potassium 133, albumin 3.1, normal LFTs.   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  Density in the gallbladder likely representing small gallstones without ductal dilation.  Per PCP visit note 05/04/2022 patient reported gradual, persistent abdominal pain.  Also pertinent patient was referred to Dr. CatLadona Horns September 2023 and provided with FOBT and refused colonoscopy.  Per note patient actively working on weight management with diet, exercise, and hydration.  Current med list from [facility] reports antacid suspension, famotidine, and pantoprazole recently discontinued.  Today: Patient Sister [name] with her helping to provide history as she was a primary caregiver for her prior to patient moving into a long-term care facility.  IBS, diarrhea: Gets really bad sharp pain in her lower abdomen. Was having a lot of diarrhea but it comes and goes. Will have diarrhea for 3 weeks straight and will have loose stools in her sleep and  need to wear diapers. Will have multiple loose stools per day needed lots of clean up. May be fine for 2 weeks. Used to have lots of constipation. Other antidiarrheals never helped. Has had bad odors as well. No fevers. No recent sick contacts.   Sister used to care for her and she was constantly constipated and was unable to get completely regular. Has been at [facility] for 3 years and the last year has been an on and off diarrhea battle. No melena or BRBPR. Has had hemorrhoids removed before. Sits on commode for long periods at a time. Currently having intermittent burning after Bms.   Has not had any blood drawn recently. Sometimes has burning in her throat. Sometimes gets choked with medication and will need extra water.   Taking naproxen as needed.   GERD: Had been taking antacids for years. Having upper abdominal pain after she eats and comes and goes and it is sharp. Worse in the evenings. Burps a lot after she drinks. Sister repots she is a lazy chewer and does not chew food well.    Current Outpatient Medications  Medication Sig Dispense Refill   Bacillus Coagulans-Inulin (PROBIOTIC FORMULA PO) Take 1 capsule by mouth daily.     benztropine (COGENTIN) 1 MG tablet Take 1 mg by mouth 2 (two) times daily.      bismuth subsalicylate (STOMACH RELIEF) 262 MG/15ML suspension Take 30 mLs by mouth 2 (two) times daily as needed for diarrhea or loose stools.     busPIRone (BUSPAR) 15 MG tablet Take 15 mg by mouth 2 (two) times daily.     cholecalciferol (VITAMIN D3) 25 MCG (  1000 UNIT) tablet Take 1,000 Units by mouth daily.     divalproex (DEPAKOTE ER) 500 MG 24 hr tablet Take 500 mg by mouth in the morning and at bedtime.      fluPHENAZine (PROLIXIN) 5 MG tablet Take 5 mg by mouth 3 (three) times daily.     Heat Wraps (THERMACARE BACK/HIP) MISC Apply 1 application topically See admin instructions. Applied to lower back on e daily as needed for soreness (Not to wear longer than 8 hours per 24  hours)     levocetirizine (XYZAL) 5 MG tablet Take 5 mg by mouth daily.     naproxen sodium (ALEVE) 220 MG tablet Take 220 mg by mouth every 8 (eight) hours as needed (for pain).     OLANZapine (ZYPREXA) 20 MG tablet Take 20 mg by mouth in the morning.     QC ANTACID 200-200-20 MG/5ML suspension Take by mouth.     sertraline (ZOLOFT) 100 MG tablet Take 100 mg by mouth every morning.      traZODone (DESYREL) 50 MG tablet Take 50 mg by mouth at bedtime.     ondansetron (ZOFRAN ODT) 4 MG disintegrating tablet Take 1 tablet (4 mg total) by mouth every 8 (eight) hours as needed for nausea or vomiting. (Patient not taking: Reported on 06/20/2022) 20 tablet 0   phenazopyridine (PYRIDIUM) 100 MG tablet Take 100 mg by mouth 3 (three) times daily as needed for pain. (Patient not taking: Reported on 06/20/2022)     prednisoLONE acetate (PRED FORTE) 1 % ophthalmic suspension SMARTSIG:In Eye(s) (Patient not taking: Reported on 06/20/2022)     No current facility-administered medications for this visit.    Past Medical History:  Diagnosis Date   Depression    Fatty liver    Schizoaffective disorder, bipolar type ([place])    Seizures ([place])     Past Surgical History:  Procedure Laterality Date   TUBAL LIGATION      Family History  Problem Relation Age of Onset   Cancer Maternal Grandmother    Cancer Father        lung   COPD Father    Breast cancer Mother    Other Paternal Grandfather        died after blood transfusion   Cancer Maternal Grandfather        lung    Allergies as of 06/20/2022 - Review Complete 06/20/2022  Allergen Reaction Noted   Aspirin     Nabumetone      Social History   Socioeconomic History   Marital status: Single    Spouse name: Not on file   Number of children: Not on file   Years of education: Not on file   Highest education level: Not on file  Occupational History   Not on file  Tobacco Use   Smoking status: Former    Types: Cigarettes   Smokeless  tobacco: Never  Vaping Use   Vaping Use: Never used  Substance and Sexual Activity   Alcohol use: No   Drug use: No   Sexual activity: Not Currently    Birth control/protection: Surgical    Comment: tubal  Other Topics Concern   Not on file  Social History Narrative   Not on file   Social Determinants of Health   Financial Resource Strain: Not on file  Food Insecurity: Not on file  Transportation Needs: Not on file  Physical Activity: Not on file  Stress: Not on file  Social Connections: Not on file  Intimate Partner Violence: Not on file     Review of Systems   Gen: Denies any fever, chills, fatigue, weight loss, lack of appetite.  CV: Denies chest pain, heart palpitations, peripheral edema, syncope.  Resp: Denies shortness of breath at rest or with exertion. Denies wheezing or cough.  GI: see HPI GU : Denies urinary burning, urinary frequency, urinary hesitancy MS: Denies joint pain, muscle weakness, cramps, or limitation of movement.  Derm: Denies rash, itching, dry skin Psych: Denies depression, anxiety, memory loss, and confusion Heme: Denies bruising, bleeding, and enlarged lymph nodes.   Physical Exam   BP (!) 160/66 (BP Location: Right Arm, Patient Position: Sitting, Cuff Size: Large)   Pulse 72   Temp 97.6 F (36.4 C) (Temporal)   Ht 5' 7"$  (1.702 m)   Wt 252 lb 6.4 oz (114.5 kg)   LMP  (Approximate)   SpO2 95%   BMI 39.53 kg/m   General:   Alert and oriented. Pleasant and cooperative. Well-nourished and well-developed.  Head:  Normocephalic and atraumatic. Eyes:  Without icterus, sclera clear and conjunctiva pink.  Ears:  Normal auditory acuity. Mouth: No dentition, currently without dentures. Lungs:  Clear to auscultation bilaterally. No wheezes, rales, or rhonchi. No distress.  Heart:  S1, S2 present without murmurs appreciated.  Abdomen:  +BS, soft, non-distended. Ttp to lower abdomen and epigastric region. Very mild ttp to RUQ. No HSM noted. No  guarding or rebound. No masses appreciated.  Rectal:  Deferred  Msk:  Symmetrical without gross deformities. Normal posture. Extremities:  Without edema. Neurologic:  Alert and  oriented x4;  grossly normal neurologically. Skin:  Intact without significant lesions or rashes. Psych:  Alert and cooperative. Normal mood and affect.   Assessment   ADASHA DAYMON is a 57 y.o. female with a history of hemorrhoids s/p hemorrhoidectomy in 2006, depression, seizures, bipolar/schizoaffective disorder, fatty liver, GERD, constipation presenting today for evaluation of possible IBS.  GERD: Per sister she has been on PPI long-term as well as other antacids.  Appears her PCP intentions were to stop a couple of these medications and reduce the dose of couple days however it appears that long-term care has discontinued PPI, H2 blocker, and over-the-counter antacid altogether.  She does complain of occasional reflux of acid, occasional chest pain when laying flat at [facility] related to reflux, and epigastric pain with or without meals, sometimes worse than others.  Also with some vague dysphagia however likely secondary to poor dentition and poor chewing capability.  Has occasional nausea symptoms as well but without vomiting. Given her tenderness today and upper abdominal pain we will restart famotidine 40 mg nightly given her symptoms are worse in the evenings and further evaluate with upper endoscopy.  Pending results may ultimately need to resume long-term low dose PPI therapy.  Abdominal pain: History of chronic abdominal pain, primarily epigastric.  Also has having some occasional lower abdominal pains.  Has history of constipation however for the last couple months has been having diarrhea as stated below.  She is tender today on exam to her epigastric region and slightly to her right upper quadrant.  Also with some mild tenderness to her lower abdomen.  Per documents from LTC it appears her antiacids including  famotidine and pantoprazole recently discontinued.  As stated above we will treat GERD with famotidine 40 mg nightly and proceed with EGD.  Given her change in bowel habits and lower abdominal pain and history of colon polyps we will evaluate with  colonoscopy as stated below.  Diarrhea, change in bowel habits: Has had several occasions of diarrhea with multiple loose stools per day lasting for about 3 weeks at a time with about a 1 to 2-week break with more normal bowel movements in between.  Also having some incontinence of diarrhea at [facility] during the cycle.  Bowel habits are associated with abdominal pain as well and occasionally will have some relief after bowel movements.  Appears some over-the-counter antidiarrheal regimens have not been effective during this time.  Currently taking over-the-counter probiotic.  Previously with history of chronic constipation.  No formal workup done as of recently therefore we will evaluate with CBC, CMP, TSH, lipase, celiac serologies, and fecal elastase given the amount and frequency of diarrhea.  Also given history of colon polyps and his change in bowel habits we will proceed with colonoscopy for further evaluation including possible biopsies to rule out microscopic colitis.  This ultimately could be IBS however unable to rule out SIBO as well.  If all other testing negative could consider trial of Xifaxan.   History of colon polyps: Last TCS in 2007 with a couple hyperplastic polyps. No prior TCS since age 47.  Given change in bowel habits and diarrhea we will proceed with colonoscopy for further evaluation as well as surveillance.  Patient and her sister have agreed.   PLAN   Restart famotidine 40 mg nightly.  CBC, CMP, TSH, CRP, Lipase, Celiac serologies, fecal elastase Proceed with upper endoscopy and colonoscopy with propofol by Dr. Gala Romney in near future: the risks, benefits, and alternatives have been discussed with the patient in detail. The patient states  understanding and desires to proceed. ASA 2 May consider trial of Xifaxin for IBS-D vs SIBO if all other testing negative.  Follow up in 2 months.     Sheila Night, MSN, FNP-BC, AGACNP-BC [facility]

## 2022-06-20 ENCOUNTER — Encounter: Payer: Self-pay | Admitting: Gastroenterology

## 2022-06-20 ENCOUNTER — Encounter: Payer: Self-pay | Admitting: *Deleted

## 2022-06-20 ENCOUNTER — Ambulatory Visit (INDEPENDENT_AMBULATORY_CARE_PROVIDER_SITE_OTHER): Payer: Medicaid Other | Admitting: Gastroenterology

## 2022-06-20 VITALS — BP 160/66 | HR 72 | Temp 97.6°F | Ht 67.0 in | Wt 252.4 lb

## 2022-06-20 DIAGNOSIS — R1030 Lower abdominal pain, unspecified: Secondary | ICD-10-CM

## 2022-06-20 DIAGNOSIS — K219 Gastro-esophageal reflux disease without esophagitis: Secondary | ICD-10-CM | POA: Diagnosis not present

## 2022-06-20 DIAGNOSIS — Z86010 Personal history of colonic polyps: Secondary | ICD-10-CM

## 2022-06-20 DIAGNOSIS — R197 Diarrhea, unspecified: Secondary | ICD-10-CM | POA: Diagnosis not present

## 2022-06-20 DIAGNOSIS — G8929 Other chronic pain: Secondary | ICD-10-CM

## 2022-06-20 DIAGNOSIS — R1013 Epigastric pain: Secondary | ICD-10-CM | POA: Diagnosis not present

## 2022-06-20 MED ORDER — PEG 3350-KCL-NA BICARB-NACL 420 G PO SOLR: 4000.0000 mL | Freq: Once | ORAL | 0 refills | Status: AC

## 2022-06-20 NOTE — Patient Instructions (Signed)
We are scheduling you for an upper endoscopy and a colonoscopy in the near future with Dr. Gala Romney.  I am ordering labs for you to have completed as well as some stool studies.  Please complete this at your earliest convenience.  Please restart taking famotidine, 40 mg nightly.  Follow a GERD diet:  Avoid fried, fatty, greasy, spicy, citrus foods. Avoid caffeine and carbonated beverages. Avoid chocolate. Try eating 4-6 small meals a day rather than 3 large meals. Do not eat within 3 hours of laying down. Prop head of bed up on wood or bricks to create a 6 inch incline if able.  You may use Imodium as needed when diarrhea occurs, please notify if you need more than 8 mg/day.  We will plan to follow-up in 2 months, or sooner if needed.  It was a pleasure to see you today. I want to create trusting relationships with patients. If you receive a survey regarding your visit,  I greatly appreciate you taking time to fill this out on paper or through your MyChart. I value your feedback.  Venetia Night, MSN, FNP-BC, AGACNP-BC [facility]

## 2022-06-28 ENCOUNTER — Other Ambulatory Visit: Payer: Self-pay | Admitting: Gastroenterology

## 2022-07-11 ENCOUNTER — Ambulatory Visit (HOSPITAL_COMMUNITY)
Admission: RE | Admit: 2022-07-11 | Discharge: 2022-07-11 | Disposition: A | Discharge status: Home / Self Care | Payer: Medicaid Other | Attending: Internal Medicine | Admitting: Internal Medicine

## 2022-07-11 ENCOUNTER — Ambulatory Visit (HOSPITAL_COMMUNITY): Payer: Medicaid Other | Admitting: Anesthesiology

## 2022-07-11 ENCOUNTER — Encounter (HOSPITAL_COMMUNITY): Payer: Self-pay | Admitting: Internal Medicine

## 2022-07-11 ENCOUNTER — Ambulatory Visit (HOSPITAL_BASED_OUTPATIENT_CLINIC_OR_DEPARTMENT_OTHER): Payer: Medicaid Other | Admitting: Anesthesiology

## 2022-07-11 ENCOUNTER — Encounter (HOSPITAL_COMMUNITY)
Admission: RE | Disposition: A | Discharge status: Home / Self Care | Payer: Self-pay | Source: Home / Self Care | Attending: Internal Medicine

## 2022-07-11 ENCOUNTER — Other Ambulatory Visit: Payer: Self-pay

## 2022-07-11 DIAGNOSIS — K580 Irritable bowel syndrome with diarrhea: Secondary | ICD-10-CM | POA: Insufficient documentation

## 2022-07-11 DIAGNOSIS — K449 Diaphragmatic hernia without obstruction or gangrene: Secondary | ICD-10-CM

## 2022-07-11 DIAGNOSIS — R197 Diarrhea, unspecified: Secondary | ICD-10-CM

## 2022-07-11 DIAGNOSIS — Z87891 Personal history of nicotine dependence: Secondary | ICD-10-CM | POA: Diagnosis not present

## 2022-07-11 DIAGNOSIS — K222 Esophageal obstruction: Secondary | ICD-10-CM | POA: Diagnosis not present

## 2022-07-11 DIAGNOSIS — R1310 Dysphagia, unspecified: Secondary | ICD-10-CM | POA: Insufficient documentation

## 2022-07-11 DIAGNOSIS — K219 Gastro-esophageal reflux disease without esophagitis: Secondary | ICD-10-CM | POA: Diagnosis not present

## 2022-07-11 DIAGNOSIS — F209 Schizophrenia, unspecified: Secondary | ICD-10-CM | POA: Diagnosis not present

## 2022-07-11 DIAGNOSIS — F319 Bipolar disorder, unspecified: Secondary | ICD-10-CM | POA: Diagnosis not present

## 2022-07-11 DIAGNOSIS — Z538 Procedure and treatment not carried out for other reasons: Secondary | ICD-10-CM | POA: Diagnosis not present

## 2022-07-11 DIAGNOSIS — R1013 Epigastric pain: Secondary | ICD-10-CM | POA: Diagnosis present

## 2022-07-11 DIAGNOSIS — G8929 Other chronic pain: Secondary | ICD-10-CM | POA: Diagnosis not present

## 2022-07-11 DIAGNOSIS — R569 Unspecified convulsions: Secondary | ICD-10-CM | POA: Insufficient documentation

## 2022-07-11 DIAGNOSIS — R194 Change in bowel habit: Secondary | ICD-10-CM | POA: Diagnosis not present

## 2022-07-11 DIAGNOSIS — Z86010 Personal history of colonic polyps: Secondary | ICD-10-CM | POA: Diagnosis not present

## 2022-07-11 HISTORY — PX: ESOPHAGOGASTRODUODENOSCOPY (EGD) WITH PROPOFOL: SHX5813

## 2022-07-11 HISTORY — PX: FLEXIBLE SIGMOIDOSCOPY: SHX5431

## 2022-07-11 SURGERY — SIGMOIDOSCOPY, FLEXIBLE
Anesthesia: General

## 2022-07-11 MED ORDER — LACTATED RINGERS IV SOLN
INTRAVENOUS | Status: DC
  Administered 2022-07-11: 1000 mL via INTRAVENOUS

## 2022-07-11 MED ORDER — LIDOCAINE 2% (20 MG/ML) 5 ML SYRINGE
INTRAMUSCULAR | Status: DC | PRN
  Administered 2022-07-11: 50 mg via INTRAVENOUS

## 2022-07-11 MED ORDER — PROPOFOL 10 MG/ML IV BOLUS
INTRAVENOUS | Status: DC | PRN
  Administered 2022-07-11: 60 mg via INTRAVENOUS

## 2022-07-11 MED ORDER — PROPOFOL 500 MG/50ML IV EMUL
INTRAVENOUS | Status: DC | PRN
  Administered 2022-07-11: 175 ug/kg/min via INTRAVENOUS

## 2022-07-11 NOTE — Discharge Instructions (Signed)
EGD Discharge instructions Please read the instructions outlined below and refer to this sheet in the next few weeks. These discharge instructions provide you with general information on caring for yourself after you leave the hospital. Your doctor may also give you specific instructions. While your treatment has been planned according to the most current medical practices available, unavoidable complications occasionally occur. If you have any problems or questions after discharge, please call your doctor. ACTIVITY You may resume your regular activity but move at a slower pace for the next 24 hours.  Take frequent rest periods for the next 24 hours.  Walking will help expel (get rid of) the air and reduce the bloated feeling in your abdomen.  No driving for 24 hours (because of the anesthesia (medicine) used during the test).  You may shower.  Do not sign any important legal documents or operate any machinery for 24 hours (because of the anesthesia used during the test).  NUTRITION Drink plenty of fluids.  You may resume your normal diet.  Begin with a light meal and progress to your normal diet.  Avoid alcoholic beverages for 24 hours or as instructed by your caregiver.  MEDICATIONS You may resume your normal medications unless your caregiver tells you otherwise.  WHAT YOU CAN EXPECT TODAY You may experience abdominal discomfort such as a feeling of fullness or "gas" pains.  FOLLOW-UP Your doctor will discuss the results of your test with you.  SEEK IMMEDIATE MEDICAL ATTENTION IF ANY OF THE FOLLOWING OCCUR: Excessive nausea (feeling sick to your stomach) and/or vomiting.  Severe abdominal pain and distention (swelling).  Trouble swallowing.  Temperature over 101 F (37.8 C).  Rectal bleeding or vomiting of blood.       Sigmoidoscopy Discharge Instructions  Read the instructions outlined below and refer to this sheet in the next few weeks. These discharge instructions provide you  with general information on caring for yourself after you leave the hospital. Your doctor may also give you specific instructions. While your treatment has been planned according to the most current medical practices available, unavoidable complications occasionally occur. If you have any problems or questions after discharge, call Dr. Gala Romney at [phone]. ACTIVITY You may resume your regular activity, but move at a slower pace for the next 24 hours.  Take frequent rest periods for the next 24 hours.  Walking will help get rid of the air and reduce the bloated feeling in your belly (abdomen).  No driving for 24 hours (because of the medicine (anesthesia) used during the test).   Do not sign any important legal documents or operate any machinery for 24 hours (because of the anesthesia used during the test).  NUTRITION Drink plenty of fluids.  You may resume your normal diet as instructed by your doctor.  Begin with a light meal and progress to your normal diet. Heavy or fried foods are harder to digest and may make you feel sick to your stomach (nauseated).  Avoid alcoholic beverages for 24 hours or as instructed.  MEDICATIONS You may resume your normal medications unless your doctor tells you otherwise.  WHAT YOU CAN EXPECT TODAY Some feelings of bloating in the abdomen.  Passage of more gas than usual.  Spotting of blood in your stool or on the toilet paper.  IF YOU HAD POLYPS REMOVED DURING THE COLONOSCOPY: No aspirin products for 7 days or as instructed.  No alcohol for 7 days or as instructed.  Eat a soft diet for the next 24  hours.  FINDING OUT THE RESULTS OF YOUR TEST Not all test results are available during your visit. If your test results are not back during the visit, make an appointment with your caregiver to find out the results. Do not assume everything is normal if you have not heard from your caregiver or the medical facility. It is important for you to follow up on all of your  test results.  SEEK IMMEDIATE MEDICAL ATTENTION IF: You have more than a spotting of blood in your stool.  Your belly is swollen (abdominal distention).  You are nauseated or vomiting.  You have a temperature over 101.  You have abdominal pain or discomfort that is severe or gets worse throughout the day.      you have a hiatal hernia and a Schatzki's ring in your esophagus and stomach.  Your esophagus was stretched today  Your colon was not cleaned out and we were unable to complete a colonoscopy  Office visit with Venetia Night in 4 to 6 weeks  at patient request, I called Salvadore Dom [phone] -  reviewed findings and recommendations

## 2022-07-11 NOTE — Op Note (Signed)
[facility] Patient Name: [name] Procedure Date: 07/11/2022 10:43 AM MRN: [id number] Date of Birth: November 11, 1965 Attending MD: Norvel Richards , MD, [id number] CSN: [id number] Age: 57 Admit Type: Outpatient Procedure:                Upper GI endoscopy Indications:              Dyspepsia, Dysphagia Providers:                Norvel Richards, MD, Crystal Page, Ladoris Gene                            Technician, Technician Referring MD:              Medicines:                Propofol per Anesthesia Complications:            No immediate complications. Estimated Blood Loss:     Estimated blood loss: none. Estimated blood loss:                            none. Procedure:                Pre-Anesthesia Assessment:                           - Prior to the procedure, a History and Physical                            was performed, and patient medications and                            allergies were reviewed. The patient's tolerance of                            previous anesthesia was also reviewed. The risks                            and benefits of the procedure and the sedation                            options and risks were discussed with the patient.                            All questions were answered, and informed consent                            was obtained. Prior Anticoagulants: The patient has                            taken no anticoagulant or antiplatelet agents. ASA                            Grade Assessment: II - A patient with mild systemic  disease. After reviewing the risks and benefits,                            the patient was deemed in satisfactory condition to                            undergo the procedure.                           After obtaining informed consent, the endoscope was                            passed under direct vision. Throughout the                            procedure, the patient's blood  pressure, pulse, and                            oxygen saturations were monitored continuously. The                            [id number] scope was introduced through the                            mouth, and advanced to the second part of duodenum.                            The upper GI endoscopy was accomplished without                            difficulty. The patient tolerated the procedure                            well. Scope In: [id number] PM Scope Out: 12:41:24 PM Total Procedure Duration: 0 hours 4 minutes 43 seconds  Findings:      A mild Schatzki ring was found at the gastroesophageal junction. The       esophagus otherwise appeared normal. Gastric cavity empty.      A medium-sized hiatal hernia was present. Normal-appearing gastric       mucosa. Patent pylorus.      The duodenal bulb and second portion of the duodenum were normal. The       scope was withdrawn. Dilation was performed with a Maloney dilator with       mild resistance at 56 Fr. The dilation site was examined following       endoscope reinsertion and showed no change. Estimated blood loss: none. Impression:               - Mild Schatzki ring. Dilated                           - Medium-sized hiatal hernia.                           - Normal duodenal bulb and second portion of the  duodenum.                           - Moderate Sedation:      Moderate (conscious) sedation was personally administered by an       anesthesia professional. The following parameters were monitored: oxygen       saturation, heart rate, blood pressure, respiratory rate, EKG, adequacy       of pulmonary ventilation, and response to care. Recommendation:           - Patient has a contact number available for                            emergencies. The signs and symptoms of potential                            delayed complications were discussed with the                            patient. Return to  normal activities tomorrow.                            Written discharge instructions were provided to the                            patient.                           - Resume previous diet.                           - Continue present medications.                           - Return to my office in 6 weeks. See colonoscopy                            report Procedure Code(s):        --- Professional ---                           [phone], Esophagogastroduodenoscopy, flexible,                            transoral; diagnostic, including collection of                            specimen(s) by brushing or washing, when performed                            (separate procedure)                           43450, Dilation of esophagus, by unguided sound or                            bougie, single or multiple passes Diagnosis Code(s):        ---  Professional ---                           K22.2, Esophageal obstruction                           K44.9, Diaphragmatic hernia without obstruction or                            gangrene                           R10.13, Epigastric pain                           R13.10, Dysphagia, unspecified CPT copyright 2022 [employer]. All rights reserved. The codes documented in this report are preliminary and upon coder review may  be revised to meet current compliance requirements. Cristopher Estimable. Yameli Delamater, MD Norvel Richards, MD 07/11/2022 12:46:03 PM This report has been signed electronically. Number of Addenda: 0

## 2022-07-11 NOTE — Op Note (Signed)
[facility] Patient Name: [name] Procedure Date: 07/11/2022 11:43 AM MRN: [id number] Date of Birth: 09-10-1965 Attending MD: Norvel Richards , MD, [id number] CSN: [id number] Age: 57 Admit Type: Outpatient Procedure:                Attempted colonoscopy (sigmoidoscopy) Indications:              Change in bowel habits Providers:                Norvel Richards, MD, [place] Page, Ladoris Gene                            Technician, Technician Referring MD:              Medicines:                Propofol per Anesthesia Complications:            No immediate complications. Estimated Blood Loss:     Estimated blood loss: none. Procedure:                Pre-Anesthesia Assessment:                           - Prior to the procedure, a History and Physical                            was performed, and patient medications and                            allergies were reviewed. The patient's tolerance of                            previous anesthesia was also reviewed. The risks                            and benefits of the procedure and the sedation                            options and risks were discussed with the patient.                            All questions were answered, and informed consent                            was obtained. Prior Anticoagulants: The patient has                            taken no anticoagulant or antiplatelet agents. ASA                            Grade Assessment: II - A patient with mild systemic                            disease. After reviewing the risks and benefits,  the patient was deemed in satisfactory condition to                            undergo the procedure.                           After obtaining informed consent, the colonoscope                            was passed under direct vision. Throughout the                            procedure, the patient's blood pressure, pulse, and                             oxygen saturations were monitored continuously. The                            [phone]) scope was introduced through the                            anus with the intention of advancing to the cecum.                            The scope was advanced to the sigmoid colon before                            the procedure was aborted. Medications were given.                            The colonoscopy was performed without difficulty.                            The patient tolerated the procedure well. The                            quality of the bowel preparation was inadequate.                            The rectum was photographed. Scope In: 12:48:08 PM Scope Out: 12:50:27 PM Total Procedure Duration: 0 hours 2 minutes 19 seconds  Findings:      The perianal and digital rectal examinations were normal. Formed and       semiformed stool in the rectum and sigmoid. Colon prep inadequate.       Procedure aborted. Impression:               - Preparation of the colon was inadequate.                            Colonoscopy not complete/sigmoidoscopy                           - No specimens collected. Moderate Sedation:      Moderate (conscious) sedation was personally administered by an  anesthesia professional. The following parameters were monitored: oxygen       saturation, heart rate, blood pressure, respiratory rate, EKG, adequacy       of pulmonary ventilation, and response to care. Recommendation:           - Patient has a contact number available for                            emergencies. The signs and symptoms of potential                            delayed complications were discussed with the                            patient. Return to normal activities tomorrow.                            Written discharge instructions were provided to the                            patient.                           - Advance diet as tolerated. Return to the office                             in 6 weeks. See EGD report. Procedure Code(s):        --- Professional ---                           [phone], 83, Colonoscopy, flexible; diagnostic,                            including collection of specimen(s) by brushing or                            washing, when performed (separate procedure) Diagnosis Code(s):        --- Professional ---                           R19.4, Change in bowel habit CPT copyright 2022 American Medical Association. All rights reserved. The codes documented in this report are preliminary and upon coder review may  be revised to meet current compliance requirements. Cristopher Estimable. Ceana Fiala, MD Norvel Richards, MD 07/11/2022 12:55:25 PM This report has been signed electronically. Number of Addenda: 0

## 2022-07-11 NOTE — Transfer of Care (Signed)
Immediate Anesthesia Transfer of Care Note  Patient: [name]  Procedure(s) Performed: FLEXIBLE SIGMOIDOSCOPY ESOPHAGOGASTRODUODENOSCOPY (EGD) WITH PROPOFOL  Patient Location: Endoscopy Unit  Anesthesia Type:General  Level of Consciousness: awake and patient cooperative  Airway & Oxygen Therapy: Patient Spontanous Breathing  Post-op Assessment: Report given to RN and Post -op Vital signs reviewed and stable  Post vital signs: Reviewed and stable  Last Vitals:  Vitals Value Taken Time  BP 93/51 07/11/22 1254  Temp 36.3 C 07/11/22 1254  Pulse 65 07/11/22 1254  Resp 15 07/11/22 1254  SpO2 92 % 07/11/22 1254    Last Pain:  Vitals:   07/11/22 1254  TempSrc: Axillary  PainSc: 0-No pain      Patients Stated Pain Goal: 9 ([id number])  Complications: No notable events documented.

## 2022-07-11 NOTE — Interval H&P Note (Signed)
History and Physical Interval Note:  07/11/2022 12:23 PM  [name]  has presented today for surgery, with the diagnosis of GERD,epigastric pain,diarrhea,hx:colon polyps.  The various methods of treatment have been discussed with the patient and family. After consideration of risks, benefits and other options for treatment, the patient has consented to  Procedure(s) with comments: COLONOSCOPY WITH PROPOFOL (N/A) - 12:45 pm ESOPHAGOGASTRODUODENOSCOPY (EGD) WITH PROPOFOL (N/A) as a surgical intervention.  The patient's history has been reviewed, patient examined, no change in status, stable for surgery.  I have reviewed the patient's chart and labs.  Questions were answered to the patient's satisfaction.      Dyspepsia, dysphagia longstanding GERD.  Change in bowel habits history of colonic polyps > and previous abnormal CT of left colon.  Patient is here for EGD and colonoscopy to further evaluate.  Has not had lab work previously recommended.Marland Kitchen    EGD with possible esophageal dilation and surveillance colonoscopy today per plan. The risks, benefits, limitations, imponderables and alternatives regarding both EGD and colonoscopy have been reviewed with the patient. Questions have been answered. All parties agreeable.     Manus Rudd

## 2022-07-11 NOTE — Anesthesia Preprocedure Evaluation (Signed)
Anesthesia Evaluation  Patient identified by MRN, date of birth, ID band Patient awake    Reviewed: Allergy & Precautions, H&P , NPO status , Patient's Chart, lab work & pertinent test results, reviewed documented beta blocker date and time   Airway Mallampati: II  TM Distance: >3 FB Neck ROM: full    Dental no notable dental hx.    Pulmonary neg pulmonary ROS, former smoker   Pulmonary exam normal breath sounds clear to auscultation       Cardiovascular Exercise Tolerance: Good negative cardio ROS  Rhythm:regular Rate:Normal     Neuro/Psych Seizures -,  PSYCHIATRIC DISORDERS  Depression Bipolar Disorder Schizophrenia   Neuromuscular disease negative neurological ROS  negative psych ROS   GI/Hepatic negative GI ROS, Neg liver ROS,,,  Endo/Other  negative endocrine ROS    Renal/GU negative Renal ROS  negative genitourinary   Musculoskeletal   Abdominal   Peds  Hematology negative hematology ROS (+)   Anesthesia Other Findings   Reproductive/Obstetrics negative OB ROS                             Anesthesia Physical Anesthesia Plan  ASA: 3  Anesthesia Plan: General   Post-op Pain Management:    Induction:   PONV Risk Score and Plan: Propofol infusion  Airway Management Planned:   Additional Equipment:   Intra-op Plan:   Post-operative Plan:   Informed Consent: I have reviewed the patients History and Physical, chart, labs and discussed the procedure including the risks, benefits and alternatives for the proposed anesthesia with the patient or authorized representative who has indicated his/her understanding and acceptance.     Dental Advisory Given  Plan Discussed with: CRNA  Anesthesia Plan Comments:        Anesthesia Quick Evaluation

## 2022-07-11 NOTE — Anesthesia Procedure Notes (Signed)
Date/Time: 07/11/2022 12:29 PM  Performed by: Vista Deck, CRNAPre-anesthesia Checklist: Patient identified, Emergency Drugs available, Suction available, Timeout performed and Patient being monitored Patient Re-evaluated:Patient Re-evaluated prior to induction Oxygen Delivery Method: Nasal Cannula

## 2022-07-12 NOTE — Anesthesia Postprocedure Evaluation (Signed)
Anesthesia Post Note  Patient: [name]  Procedure(s) Performed: FLEXIBLE SIGMOIDOSCOPY ESOPHAGOGASTRODUODENOSCOPY (EGD) WITH PROPOFOL  Patient location during evaluation: Phase II Anesthesia Type: General Level of consciousness: awake Pain management: pain level controlled Vital Signs Assessment: post-procedure vital signs reviewed and stable Respiratory status: spontaneous breathing and respiratory function stable Cardiovascular status: blood pressure returned to baseline and stable Postop Assessment: no headache and no apparent nausea or vomiting Anesthetic complications: no Comments: Late entry   No notable events documented.   Last Vitals:  Vitals:   07/11/22 1254 07/11/22 1258  BP: (!) 93/51 102/60  Pulse: 65 65  Resp: 15 19  Temp: (!) 36.3 C   SpO2: 92% 93%    Last Pain:  Vitals:   07/11/22 1258  TempSrc:   PainSc: 0-No pain                 Louann Sjogren

## 2022-07-15 ENCOUNTER — Encounter (HOSPITAL_COMMUNITY): Payer: Self-pay | Admitting: Internal Medicine

## 2022-08-03 LAB — CBC
Hematocrit: 37.9 % (ref 34.0–46.6)
Hemoglobin: 12.4 g/dL (ref 11.1–15.9)
MCH: 28.6 pg (ref 26.6–33.0)
MCHC: 32.7 g/dL (ref 31.5–35.7)
MCV: 88 fL (ref 79–97)
Platelets: 278 10*3/uL (ref 150–450)
RBC: 4.33 x10E6/uL (ref 3.77–5.28)
RDW: 12.6 % (ref 11.7–15.4)
WBC: 5.6 10*3/uL (ref 3.4–10.8)

## 2022-08-03 LAB — COMPREHENSIVE METABOLIC PANEL
ALT: 7 IU/L (ref 0–32)
AST: 11 IU/L (ref 0–40)
Albumin/Globulin Ratio: 1.5 (ref 1.2–2.2)
Albumin: 4 g/dL (ref 3.8–4.9)
Alkaline Phosphatase: 65 IU/L (ref 44–121)
BUN/Creatinine Ratio: 10 (ref 9–23)
BUN: 7 mg/dL (ref 6–24)
Bilirubin Total: <0.2 mg/dL (ref 0.0–1.2)
CO2: 22 mmol/L (ref 20–29)
Calcium: 9.5 mg/dL (ref 8.7–10.2)
Chloride: 98 mmol/L (ref 96–106)
Creatinine, Ser: 0.67 mg/dL (ref 0.57–1.00)
Globulin, Total: 2.6 g/dL (ref 1.5–4.5)
Glucose: 92 mg/dL (ref 70–99)
Potassium: 4.7 mmol/L (ref 3.5–5.2)
Sodium: 136 mmol/L (ref 134–144)
Total Protein: 6.6 g/dL (ref 6.0–8.5)
eGFR: 103 mL/min/{1.73_m2} (ref >59)

## 2022-08-03 LAB — LIPASE: Lipase: 26 U/L (ref 14–72)

## 2022-08-03 LAB — TISSUE TRANSGLUTAMINASE, IGA: Transglutaminase IgA: <2 U/mL (ref 0–3)

## 2022-08-03 LAB — C-REACTIVE PROTEIN: CRP: 18 mg/L — ABNORMAL HIGH (ref 0–10)

## 2022-08-03 LAB — TSH: TSH: 1.14 u[IU]/mL (ref 0.450–4.500)

## 2022-08-03 LAB — IGA: IgA/Immunoglobulin A, Serum: 240 mg/dL (ref 87–352)

## 2022-08-05 ENCOUNTER — Other Ambulatory Visit: Payer: Self-pay | Admitting: Gastroenterology

## 2022-08-09 LAB — PANCREATIC ELASTASE, FECAL: Pancreatic Elastase, Fecal: 76 ug Elast./g — ABNORMAL LOW (ref >200)

## 2022-08-11 ENCOUNTER — Encounter: Payer: Self-pay | Admitting: *Deleted

## 2022-08-15 ENCOUNTER — Encounter: Payer: Self-pay | Admitting: Gastroenterology

## 2022-08-15 ENCOUNTER — Ambulatory Visit (INDEPENDENT_AMBULATORY_CARE_PROVIDER_SITE_OTHER): Payer: Medicaid Other | Admitting: Gastroenterology

## 2022-08-15 VITALS — BP 115/77 | HR 81 | Temp 97.5°F | Ht 67.0 in | Wt 257.4 lb

## 2022-08-15 DIAGNOSIS — Z86010 Personal history of colonic polyps: Secondary | ICD-10-CM | POA: Diagnosis not present

## 2022-08-15 DIAGNOSIS — K219 Gastro-esophageal reflux disease without esophagitis: Secondary | ICD-10-CM

## 2022-08-15 DIAGNOSIS — K5904 Chronic idiopathic constipation: Secondary | ICD-10-CM

## 2022-08-15 DIAGNOSIS — R1013 Epigastric pain: Secondary | ICD-10-CM | POA: Diagnosis not present

## 2022-08-15 DIAGNOSIS — R1030 Lower abdominal pain, unspecified: Secondary | ICD-10-CM

## 2022-08-15 DIAGNOSIS — G8929 Other chronic pain: Secondary | ICD-10-CM

## 2022-08-15 NOTE — Patient Instructions (Addendum)
Will schedule you for a colonoscopy in the near future with Dr. Gala Romney.  We are augmenting your prep and will send detailed written instructions. Extra half day of clears 1.5 preps Linzess daily for 4 days prior Take 2 bisacodyl tablets halfway through prep.   Please arrange for patient to have an x-ray of her abdomen.  We will plan to start Linzess 145 mcg daily.  Advised patient that there is a washout.  For this that can last up to 2 weeks causing frequent diarrhea.  She has likely been experiencing overflow diarrhea in the setting of ongoing constipation which is also likely the cause of her lower abdominal pain.   If unable to obtain this medication due to cost please reach out to her office and let us know for any alternatives that we can recommend.  In the meantime please start MiraLAX 17 g once nightly and if no bowel movement or if needing to strain within a couple of days then start twice daily MiraLAX.  It was a pleasure to see you today. I want to create trusting relationships with patients. If you receive a survey regarding your visit,  I greatly appreciate you taking time to fill this out on paper or through your MyChart. I value your feedback.  Venetia Night, MSN, FNP-BC, AGACNP-BC [facility]

## 2022-08-15 NOTE — Progress Notes (Signed)
GI Office Note    Referring Provider: Monico Blitz, MD Primary Care Physician:  Monico Blitz, MD Primary Gastroenterologist: Cristopher Estimable.Rourk, MD  Date:  08/15/2022  ID:  [name], DOB 03/10/1966, MRN [id number]   Chief Complaint   Chief Complaint  Patient presents with   Follow-up    Follow up on diarrhea. Pt states it is better   History of Present Illness  [name] is a 57 y.o. female with a history of hemorrhoids s/p hemorrhoidectomy in 2006, depression, seizures, bipolar/schizoaffective disorder, fatty liver, GERD, constipation presenting today for follow up.   EGD May 2007: -couple 25mm gastric polyps (benign) -normal stomach -normal duodenum -Continue PPI daily   Colonoscopy May 2007: -tight anal sphincter -prep sub-optimal -anal papilla -internal hemorrhoids -72mm rectal polyp -59mm polyp at 25cm -Anusol nightly advised for 10 days -start miralax nightly -colace BID   Cologuard negative in 2020.   Labs in May 2023 with elevated lipase 88, hemoglobin 11.8, platelets 282, potassium 133, albumin 3.1, normal LFTs.    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  Density in the gallbladder likely representing small gallstones without ductal dilation.   Per PCP visit note 05/04/2022 patient reported gradual, persistent abdominal pain.  Also patient was referred to Dr. Ladona Horns in September 2023 and provided with FOBT and refused colonoscopy.  Per note patient actively working on weight management with diet, exercise, and hydration.   Last office visit 06/20/22.  Sister was present with patient.  Patient reported getting sharp pains in her lower abdomen on have on again off again diarrhea that will occur for about 3 weeks treated with time also with some incontinence during her sleep.  May be okay for 2 weeks cycle repeats  again.  Reported no previous help with antidiarrheals.  No recent sick contacts.  Sister reported patient used to be constantly constipated.  Patient had not had GERD for 3 years but reported 1 year of on and off diarrhea.  No melena or BRBPR.  Has had hemorrhoidectomy before.  Does admit to sitting, for long periods of time and having intermittent burning after bowel movements.  Reports occasional choking with medications needing extra water and some occasional burning in throat.  Occasional NSAID use.  Had epigastric pain after eating that is sharp and worse in the evenings.  She reported being on long-term antiacid however according to her medication list and all of these medications were recently discontinued.  Sister reports patient is a lazy chewer and does not chew her food well.  Advised to restart famotidine 40 mg nightly.  Labs including fecal elastase ordered.  Scheduled for EGD and colonoscopy.  Consider trial of Xifaxan if testing negative.  EGD 07/11/2022: -Mild Schatzki's ring s/p dilation -Medium size hiatal hernia -Normal duodenum  Colonoscopy 07/11/2022: -Inadequate prep of colon.  Formed to semiformed stool in the rectum and sigmoid.  Labs 08/02/2022: Normal CBC, CMP, TSH, and lipase.  TTG IgA negative.  Pancreatic elastase 76.  Today: Diarrhea - improved.   Reports her bottom is hurting from having to strain. Drank all the colonscopy prep but did not have many bowel movements at all.  Still having lower abdominal pain that occurs occasionally.  Has as needed Pepto-Bismol and Imodium to use for diarrhea.  Currently not on any bowel regimen due to recent diarrhea.  Denies any melena or BRBPR.  Sister consulted via telephone.  She does report patient has a history of constipation with lower abdominal pain.  She was just previously concerned given the length of diarrhea that she was having previously.  Reflux controlled with famotidine and pantoprazole.  Does have occasional nausea but  no vomiting.  Has any dysphagia.  Reports a good appetite..   Current Outpatient Medications  Medication Sig Dispense Refill   acetaminophen (TYLENOL) 650 MG CR tablet Take 650 mg by mouth every 8 (eight) hours as needed for pain.     benztropine (COGENTIN) 1 MG tablet Take 1 mg by mouth 2 (two) times daily. (0800 & 2000)     bisacodyl (DULCOLAX) 5 MG EC tablet Take 5 mg by mouth as directed. Take 2 tablets by mouth at 2pm on the day before colonoscopy     bismuth subsalicylate (STOMACH RELIEF) 262 MG/15ML suspension Take 15 mLs by mouth 2 (two) times daily as needed for diarrhea or loose stools.     busPIRone (BUSPAR) 15 MG tablet Take 15 mg by mouth 2 (two) times daily. (0800 & 1600)     cholecalciferol (VITAMIN D3) 25 MCG (1000 UNIT) tablet Take 1,000 Units by mouth in the morning. (0800)     divalproex (DEPAKOTE ER) 500 MG 24 hr tablet Take 500 mg by mouth in the morning and at bedtime. (0800 & 2000)     famotidine (PEPCID) 40 MG tablet TAKE (1) TABLET BY MOUTH NIGHTLY. 30 tablet 0   fluPHENAZine (PROLIXIN) 10 MG tablet Take 10 mg by mouth 3 (three) times daily. (0800, 1400 & 2000)     Glycerin-Hypromellose-PEG 400 (ARTIFICIAL TEARS) 0.2-0.2-1 % SOLN Place 1 drop into both eyes in the morning, at noon, and at bedtime.     HYDROCORTISONE ANTI-ITCH EX Place 1 Application rectally in the morning and at bedtime. Apply 1 gram rectally scheduled (0800 & 2000) & after each bowel movement     levocetirizine (XYZAL) 5 MG tablet Take 5 mg by mouth in the morning. (0800)     loperamide (IMODIUM) 2 MG capsule Take 2 mg by mouth as needed for diarrhea or loose stools (8mg /24 hours).     Melatonin 10 MG TABS Take 10 mg by mouth at bedtime. (1900)     naproxen sodium (ALEVE) 220 MG tablet Take 220 mg by mouth every 8 (eight) hours as needed (for pain).     OLANZapine (ZYPREXA) 20 MG tablet Take 20 mg by mouth in the morning. (0800)     pantoprazole (PROTONIX) 20 MG tablet Take 20 mg by mouth in the  morning. (0800)     Pramox-PE-Glycerin-Petrolatum (HEMORRHOIDAL EX) Place 1 Application rectally daily. (0800)     sertraline (ZOLOFT) 100 MG tablet Take 100 mg by mouth every morning. (0800)     traZODone (DESYREL) 50 MG tablet Take 50 mg by mouth at bedtime.     Vitamins A & D (VITAMIN A & D) ointment Apply 1 Application topically as needed for dry skin.     No current facility-administered medications for this visit.    Past Medical History:  Diagnosis Date   Depression    Fatty liver    Schizoaffective disorder, bipolar type ([place])    Seizures ([place])     Past Surgical History:  Procedure Laterality Date   ESOPHAGOGASTRODUODENOSCOPY (EGD) WITH PROPOFOL N/A 07/11/2022   Procedure: ESOPHAGOGASTRODUODENOSCOPY (EGD) WITH PROPOFOL;  Surgeon: Daneil Dolin, MD;  Location: [facility];  Service: Endoscopy;  Laterality: N/A;   FLEXIBLE  SIGMOIDOSCOPY N/A 07/11/2022   Procedure: FLEXIBLE SIGMOIDOSCOPY;  Surgeon: Daneil Dolin, MD;  Location: [facility];  Service: Endoscopy;  Laterality: N/A;  12:45 pm   TUBAL LIGATION      Family History  Problem Relation Age of Onset   Cancer Maternal Grandmother    Cancer Father        lung   COPD Father    Breast cancer Mother    Other Paternal Grandfather        died after blood transfusion   Cancer Maternal Grandfather        lung    Allergies as of 08/15/2022 - Review Complete 08/15/2022  Allergen Reaction Noted   Aspirin     Nabumetone      Social History   Socioeconomic History   Marital status: Single    Spouse name: Not on file   Number of children: Not on file   Years of education: Not on file   Highest education level: Not on file  Occupational History   Not on file  Tobacco Use   Smoking status: Former    Types: Cigarettes   Smokeless tobacco: Never  Vaping Use   Vaping Use: Never used  Substance and Sexual Activity   Alcohol use: No   Drug use: No   Sexual activity: Not Currently    Birth  control/protection: Surgical    Comment: tubal  Other Topics Concern   Not on file  Social History Narrative   Not on file   Social Determinants of Health   Financial Resource Strain: Not on file  Food Insecurity: Not on file  Transportation Needs: Not on file  Physical Activity: Not on file  Stress: Not on file  Social Connections: Not on file     Review of Systems   Gen: Denies fever, chills, anorexia. Denies fatigue, weakness, weight loss.  CV: Denies chest pain, palpitations, syncope, peripheral edema, and claudication. Resp: Denies dyspnea at rest, cough, wheezing, coughing up blood, and pleurisy. GI: See HPI Derm: Denies rash, itching, dry skin Psych: Denies depression, anxiety, memory loss, confusion. No homicidal or suicidal ideation.  Heme: Denies bruising, bleeding, and enlarged lymph nodes.   Physical Exam   BP 115/77   Pulse 81   Temp (!) 97.5 F (36.4 C)   Ht 5\' 7"  (1.702 m)   Wt 257 lb 6.4 oz (116.8 kg)   BMI 40.31 kg/m   General:   Alert and oriented. No distress noted. Pleasant and cooperative.  Head:  Normocephalic and atraumatic. Eyes:  Conjuctiva clear without scleral icterus. Mouth:  Oral mucosa pink and moist. Good dentition. No lesions. Lungs:  Clear to auscultation bilaterally. No wheezes, rales, or rhonchi. No distress.  Heart:  S1, S2 present without murmurs appreciated.  Abdomen:  +BS, soft, non-distended.  Moderate TTP to lower abdomen, mild TTP to epigastrium.  No rebound or guarding. No HSM or masses noted. Rectal: Deferred Msk:  Symmetrical without gross deformities. Normal posture. Extremities:  Without edema. Neurologic:  Alert and  oriented x4 Psych:  Alert and cooperative. Normal mood and affect.   Assessment  [name] is a 57 y.o. female with a history of hemorrhoids s/p hemorrhoidectomy in 2006, depression, seizures, bipolar/schizoaffective disorder, fatty liver, GERD, constipation presenting today for follow up.    Previous change in bowel habits, constipation, abdominal pain: Long term history of constipation but for the last 1 year she has had intermittent diarrhea for weeks at a time. This is  also associated with lower abdominal pain. Recent workup with normal CBC, CMP, Lipase, TSH. Fecal elastase low at 76. Based on colonoscopy patient has had formed and semi formed stool in the sigmoid and rectum.  For now we will hold off on starting any pancreatic enzyme therapy as she has not losing weight and likely diarrhea is in the setting of constipation.  Will need KUB to assess for ongoing stool burden/constipation.  She has not been having any diarrhea, she has actually been having difficulties with straining since her last visit.  Denies any melena or BRBPR.  She did have tenderness to her lower abdomen today on exam.  Will start Linzess 145 mcg daily and if unable to obtain medication due to cost advised facility to reach out for any alternatives recommended by insurance.  If unable to obtain medication advised once to twice daily MiraLAX.  Will have further recommendations after receiving results of KUB.  GERD: Previous history of long term antacid use. Occasional dysphagia suspected to be secondary to poor dentition and inadequate chewing. Recent EGD 07/11/22 with normal esophagus and stomach. Presence of Schatzki's ring s/p dilation. Currently on famotidine 40 mg nightly and pantoprazole 20 mg once daily.  Does have some mild TTP to epigastrium.  Will continue current regimen.  History of colon polyps: Last colonoscopy in 2007 with hyperplastic polyps. Colonoscopy planned 07/11/22 was aborted due to inadequate prep. Formed and semi formed stool present.  Discussed repeating colonoscopy with patient's sister who has been her caregiver and she is agreeable to proceed with repeating colonoscopy.  We discussed augmenting her bowel prep and she is agreeable.  Patient is also agreeable she would just like to avoid enema  given it was painful previously.  I thoroughly discussed with patient and her sister the need for a longer prep time as well as additional medication to ensure good colonoscopy prep.  PLAN   KUB Start Linzess 145 mcg daily.  Advised for facility to reach out if inability to obtain medication due to cost. Start once to twice daily MiraLAX if unable to obtain Linzess. Proceed with colonoscopy with propofol by Dr. Gala Romney in near future: the risks, benefits, and alternatives have been discussed with the patient in detail. The patient states understanding and desires to proceed. ASA 3 Extra half day of clears 1.5 preps Linzess daily for 4 days prior Take 2 bisacodyl tablets before starting prep and halfway through prep.  No enema for patient. Stop Imodium Continue famotidine 40 mg nightly Continue pantoprazole 20 mg daily Follow-up in 4 months, sooner if needed.    Venetia Night, MSN, FNP-BC, AGACNP-BC [facility]

## 2022-08-16 ENCOUNTER — Telehealth (INDEPENDENT_AMBULATORY_CARE_PROVIDER_SITE_OTHER): Payer: Self-pay

## 2022-08-16 ENCOUNTER — Other Ambulatory Visit: Payer: Self-pay | Admitting: *Deleted

## 2022-08-16 ENCOUNTER — Ambulatory Visit (HOSPITAL_COMMUNITY)
Admission: RE | Admit: 2022-08-16 | Discharge: 2022-08-16 | Disposition: A | Discharge status: Home / Self Care | Payer: Medicaid Other | Source: Ambulatory Visit | Attending: Gastroenterology | Admitting: Gastroenterology

## 2022-08-16 ENCOUNTER — Encounter: Payer: Self-pay | Admitting: *Deleted

## 2022-08-16 ENCOUNTER — Telehealth: Payer: Self-pay | Admitting: *Deleted

## 2022-08-16 ENCOUNTER — Other Ambulatory Visit: Payer: Self-pay | Admitting: Gastroenterology

## 2022-08-16 DIAGNOSIS — R1013 Epigastric pain: Secondary | ICD-10-CM | POA: Diagnosis not present

## 2022-08-16 DIAGNOSIS — K5904 Chronic idiopathic constipation: Secondary | ICD-10-CM | POA: Diagnosis present

## 2022-08-16 DIAGNOSIS — G8929 Other chronic pain: Secondary | ICD-10-CM | POA: Diagnosis present

## 2022-08-16 MED ORDER — PEG 3350-KCL-NA BICARB-NACL 420 G PO SOLR: 4000.0000 mL | Freq: Once | ORAL | 0 refills | Status: AC

## 2022-08-16 MED ORDER — LINACLOTIDE 145 MCG PO CAPS: 145.0000 ug | ORAL_CAPSULE | Freq: Every day | ORAL | 3 refills | Status: AC

## 2022-08-16 NOTE — Telephone Encounter (Signed)
Linzess Rx sent to [facility] on 08/16/2022 via fax [phone].

## 2022-08-16 NOTE — Telephone Encounter (Signed)
Pt was seen yesterday (08/15/22) was you going to start her on Linzess or did she get samples?  The instructions for her colonoscopy say to take Linzess 4 days prior. Please advise. Thank you

## 2022-08-16 NOTE — Telephone Encounter (Signed)
Called facility and was made aware of providers message.

## 2022-08-24 ENCOUNTER — Other Ambulatory Visit: Payer: Self-pay | Admitting: Gastroenterology

## 2022-09-19 ENCOUNTER — Encounter (HOSPITAL_COMMUNITY)
Admission: RE | Admit: 2022-09-19 | Discharge: 2022-09-19 | Disposition: A | Discharge status: Home / Self Care | Payer: Medicaid Other | Source: Ambulatory Visit | Attending: Internal Medicine | Admitting: Internal Medicine

## 2022-09-21 ENCOUNTER — Ambulatory Visit (HOSPITAL_BASED_OUTPATIENT_CLINIC_OR_DEPARTMENT_OTHER): Payer: Medicaid Other | Admitting: Anesthesiology

## 2022-09-21 ENCOUNTER — Ambulatory Visit (HOSPITAL_COMMUNITY)
Admission: RE | Admit: 2022-09-21 | Discharge: 2022-09-21 | Disposition: A | Discharge status: Home / Self Care | Payer: Medicaid Other | Attending: Internal Medicine | Admitting: Internal Medicine

## 2022-09-21 ENCOUNTER — Encounter (HOSPITAL_COMMUNITY): Payer: Self-pay | Admitting: Internal Medicine

## 2022-09-21 ENCOUNTER — Ambulatory Visit (HOSPITAL_COMMUNITY): Payer: Medicaid Other | Admitting: Anesthesiology

## 2022-09-21 ENCOUNTER — Encounter (HOSPITAL_COMMUNITY)
Admission: RE | Disposition: A | Discharge status: Home / Self Care | Payer: Self-pay | Source: Home / Self Care | Attending: Internal Medicine

## 2022-09-21 DIAGNOSIS — Z860100 Personal history of colon polyps, unspecified: Secondary | ICD-10-CM

## 2022-09-21 DIAGNOSIS — F319 Bipolar disorder, unspecified: Secondary | ICD-10-CM | POA: Insufficient documentation

## 2022-09-21 DIAGNOSIS — D120 Benign neoplasm of cecum: Secondary | ICD-10-CM | POA: Diagnosis not present

## 2022-09-21 DIAGNOSIS — Z1211 Encounter for screening for malignant neoplasm of colon: Secondary | ICD-10-CM | POA: Diagnosis present

## 2022-09-21 DIAGNOSIS — Q438 Other specified congenital malformations of intestine: Secondary | ICD-10-CM | POA: Insufficient documentation

## 2022-09-21 DIAGNOSIS — Z6841 Body Mass Index (BMI) 40.0 and over, adult: Secondary | ICD-10-CM | POA: Diagnosis not present

## 2022-09-21 DIAGNOSIS — Z87891 Personal history of nicotine dependence: Secondary | ICD-10-CM | POA: Diagnosis not present

## 2022-09-21 DIAGNOSIS — D127 Benign neoplasm of rectosigmoid junction: Secondary | ICD-10-CM | POA: Diagnosis not present

## 2022-09-21 DIAGNOSIS — R569 Unspecified convulsions: Secondary | ICD-10-CM | POA: Diagnosis not present

## 2022-09-21 DIAGNOSIS — K635 Polyp of colon: Secondary | ICD-10-CM | POA: Insufficient documentation

## 2022-09-21 HISTORY — PX: COLONOSCOPY WITH PROPOFOL: SHX5780

## 2022-09-21 HISTORY — PX: POLYPECTOMY: SHX5525

## 2022-09-21 SURGERY — COLONOSCOPY WITH PROPOFOL
Anesthesia: General

## 2022-09-21 MED ORDER — LACTATED RINGERS IV SOLN: INTRAVENOUS | Status: DC

## 2022-09-21 MED ORDER — EPHEDRINE 5 MG/ML INJ
INTRAVENOUS | Status: AC
  Filled 2022-09-21: qty 5

## 2022-09-21 MED ORDER — PROPOFOL 10 MG/ML IV BOLUS
INTRAVENOUS | Status: DC | PRN
  Administered 2022-09-21: 80 mg via INTRAVENOUS

## 2022-09-21 MED ORDER — PROPOFOL 500 MG/50ML IV EMUL
INTRAVENOUS | Status: AC
  Filled 2022-09-21: qty 50

## 2022-09-21 MED ORDER — PROPOFOL 500 MG/50ML IV EMUL
INTRAVENOUS | Status: DC | PRN
  Administered 2022-09-21: 175 ug/kg/min via INTRAVENOUS

## 2022-09-21 MED ORDER — EPHEDRINE SULFATE-NACL 50-0.9 MG/10ML-% IV SOSY
PREFILLED_SYRINGE | INTRAVENOUS | Status: DC | PRN
  Administered 2022-09-21 (×4): 5 mg via INTRAVENOUS

## 2022-09-21 NOTE — H&P (Signed)
 @   Primary Care Physician:  [name], MD Primary Gastroenterologist:  Dr. Jena Gauss  Pre-Procedure History & Physical: HPI:  [name] is a 57 y.o. female here for screening colonoscopy.  Poor prep thwarted efforts at colonoscopy February.  Of this year.  Past Medical History:  Diagnosis Date   Depression    Fatty liver    Schizoaffective disorder, bipolar type    Seizures     Past Surgical History:  Procedure Laterality Date   ESOPHAGOGASTRODUODENOSCOPY (EGD) WITH PROPOFOL N/A 07/11/2022   Procedure: ESOPHAGOGASTRODUODENOSCOPY (EGD) WITH PROPOFOL;  Surgeon: Corbin Ade, MD;  Location: [facility];  Service: Endoscopy;  Laterality: N/A;   FLEXIBLE SIGMOIDOSCOPY N/A 07/11/2022   Procedure: FLEXIBLE SIGMOIDOSCOPY;  Surgeon: Corbin Ade, MD;  Location: [facility];  Service: Endoscopy;  Laterality: N/A;  12:45 pm   TUBAL LIGATION      Prior to Admission medications   Medication Sig Start Date End Date Taking? Authorizing Provider  acetaminophen (TYLENOL) 650 MG CR tablet Take 650 mg by mouth every 8 (eight) hours as needed for pain.   Yes [provider]  benztropine (COGENTIN) 1 MG tablet Take 1 mg by mouth 2 (two) times daily. (0800 & 2000)   Yes [provider]  bismuth subsalicylate (STOMACH RELIEF) 262 MG/15ML suspension Take 15 mLs by mouth 2 (two) times daily as needed for diarrhea or loose stools.   Yes [provider]  busPIRone (BUSPAR) 15 MG tablet Take 15 mg by mouth 2 (two) times daily. (0800 & 1600)   Yes [provider]  cholecalciferol (VITAMIN D3) 25 MCG (1000 UNIT) tablet Take 1,000 Units by mouth in the morning. (0800)   Yes [provider]  divalproex (DEPAKOTE ER) 500 MG 24 hr tablet Take 500 mg by mouth in the morning and at bedtime. (0800 & 2000)   Yes [provider]  famotidine (PEPCID) 40 MG tablet TAKE (1) TABLET BY MOUTH NIGHTLY. 08/24/22  Yes Mahon, Courtney L, NP  fluPHENAZine  (PROLIXIN) 10 MG tablet Take 10 mg by mouth 3 (three) times daily. (0800, 1400 & 2000) 08/19/19  Yes [provider]  gabapentin (NEURONTIN) 100 MG capsule Take 100 mg by mouth 2 (two) times daily.   Yes [provider]  Glycerin-Hypromellose-PEG 400 (ARTIFICIAL TEARS) 0.2-0.2-1 % SOLN Place 1 drop into both eyes in the morning, at noon, and at bedtime.   Yes [provider]  levocetirizine (XYZAL) 5 MG tablet Take 5 mg by mouth in the morning. (0800) 05/31/22  Yes [provider]  linaclotide (LINZESS) 145 MCG CAPS capsule Take 1 capsule (145 mcg total) by mouth daily before breakfast. 08/16/22  Yes Mahon, Frederik Schmidt, NP  loperamide (IMODIUM) 2 MG capsule Take 2 mg by mouth as needed for diarrhea or loose stools. Do not exceed /24 hours   Yes [provider]  Melatonin 10 MG TABS Take 10 mg by mouth at bedtime. (1900)   Yes [provider]  Menthol, Topical Analgesic, (BIOFREEZE) 4 % GEL Apply 1 mL topically 2 (two) times daily as needed (pain). Apply to back   Yes [provider]  naproxen sodium (ALEVE) 220 MG tablet Take 220 mg by mouth every 8 (eight) hours as needed (for pain).   Yes [provider]  nystatin cream (MYCOSTATIN) Apply 1 Application topically 3 (three) times daily as needed (rash, itching, or burning). Apply to vagina/groin   Yes [provider]  OLANZapine (ZYPREXA) 20  MG tablet Take 20 mg by mouth in the morning. (0800)   Yes [provider]  pantoprazole sodium (PROTONIX) 40 mg Take 40 mg by mouth in the morning. (0800)   Yes [provider]  Pramox-PE-Glycerin-Petrolatum (HEMORRHOIDAL EX) Place 1 Application rectally daily. (0800)   Yes [provider]  sertraline (ZOLOFT) 100 MG tablet Take 150 mg by mouth every morning. (0800)   Yes [provider]  traZODone (DESYREL) 50 MG tablet Take 50 mg by mouth at bedtime.   Yes [provider]  Vitamins A & D  (VITAMIN A & D) ointment Apply 1 Application topically as needed for dry skin.   Yes [provider]    Allergies as of 08/16/2022 - Review Complete 08/15/2022  Allergen Reaction Noted   Aspirin     Nabumetone      Family History  Problem Relation Age of Onset   Cancer Maternal Grandmother    Cancer Father        lung   COPD Father    Breast cancer Mother    Other Paternal Grandfather        died after blood transfusion   Cancer Maternal Grandfather        lung    Social History   Socioeconomic History   Marital status: Single    Spouse name: Not on file   Number of children: Not on file   Years of education: Not on file   Highest education level: Not on file  Occupational History   Not on file  Tobacco Use   Smoking status: Former    Types: Cigarettes   Smokeless tobacco: Never  Vaping Use   Vaping Use: Never used  Substance and Sexual Activity   Alcohol use: No   Drug use: No   Sexual activity: Not Currently    Birth control/protection: Surgical    Comment: tubal  Other Topics Concern   Not on file  Social History Narrative   Not on file   Social Determinants of Health   Financial Resource Strain: Not on file  Food Insecurity: Not on file  Transportation Needs: Not on file  Physical Activity: Not on file  Stress: Not on file  Social Connections: Not on file  Intimate Partner Violence: Not on file    Review of Systems: See HPI, otherwise negative ROS  Physical Exam: BP 127/77   Pulse 69   Temp 98.1 F (36.7 C) (Oral)   Resp 16   Ht  (1.702 m)   SpO2 95%   BMI 40.33 kg/m  General:   Alert,  Well-developed, well-nourished, pleasant and cooperative in NAD Lungs:  Clear throughout to auscultation.   No wheezes, crackles, or rhonchi. No acute distress. Heart:  Regular rate and rhythm; no murmurs, clicks, rubs,  or gallops. Abdomen: Non-distended, normal bowel sounds.  Soft and nontender without appreciable mass or  hepatosplenomegaly.  Pulses:  Normal pulses noted. Extremities:  Without clubbing or edema.  Impression/Plan: 57 year old lady overdue for a complete screening colonoscopy.  She is here today hopefully she will have adequate preparation.The risks, benefits, limitations, alternatives and imponderables have been reviewed with the patient. Questions have been answered. All parties are agreeable.       Notice: This dictation was prepared with Dragon dictation along with smaller phrase technology. Any transcriptional errors that result from this process are unintentional and may not be corrected upon review.

## 2022-09-21 NOTE — Anesthesia Postprocedure Evaluation (Signed)
Anesthesia Post Note  Patient: [name]  Procedure(s) Performed: COLONOSCOPY WITH PROPOFOL POLYPECTOMY  Patient location during evaluation: Phase II Anesthesia Type: General Level of consciousness: awake and alert and oriented Pain management: pain level controlled Vital Signs Assessment: post-procedure vital signs reviewed and stable Respiratory status: spontaneous breathing, nonlabored ventilation and respiratory function stable Cardiovascular status: blood pressure returned to baseline and stable Postop Assessment: no apparent nausea or vomiting Anesthetic complications: no  No notable events documented.   Last Vitals:  Vitals:   09/21/22 0902 09/21/22 1031  BP: 127/77 (!) 112/48  Pulse: 69 72  Resp: 16 14  Temp: 36.7 C 36.7 C  SpO2: 95% 97%    Last Pain:  Vitals:   09/21/22 1031  TempSrc: Axillary  PainSc: 0-No pain                 Oney Tatlock C Halaina Vanduzer

## 2022-09-21 NOTE — Discharge Instructions (Addendum)
  Colonoscopy Discharge Instructions  Read the instructions outlined below and refer to this sheet in the next few weeks. These discharge instructions provide you with general information on caring for yourself after you leave the hospital. Your doctor may also give you specific instructions. While your treatment has been planned according to the most current medical practices available, unavoidable complications occasionally occur. If you have any problems or questions after discharge, call Dr. Jena Gauss at [phone]. ACTIVITY You may resume your regular activity, but move at a slower pace for the next 24 hours.  Take frequent rest periods for the next 24 hours.  Walking will help get rid of the air and reduce the bloated feeling in your belly (abdomen).  No driving for 24 hours (because of the medicine (anesthesia) used during the test).   Do not sign any important legal documents or operate any machinery for 24 hours (because of the anesthesia used during the test).  NUTRITION Drink plenty of fluids.  You may resume your normal diet as instructed by your doctor.  Begin with a light meal and progress to your normal diet. Heavy or fried foods are harder to digest and may make you feel sick to your stomach (nauseated).  Avoid alcoholic beverages for 24 hours or as instructed.  MEDICATIONS You may resume your normal medications unless your doctor tells you otherwise.  WHAT YOU CAN EXPECT TODAY Some feelings of bloating in the abdomen.  Passage of more gas than usual.  Spotting of blood in your stool or on the toilet paper.  IF YOU HAD POLYPS REMOVED DURING THE COLONOSCOPY: No aspirin products for 7 days or as instructed.  No alcohol for 7 days or as instructed.  Eat a soft diet for the next 24 hours.  FINDING OUT THE RESULTS OF YOUR TEST Not all test results are available during your visit. If your test results are not back during the visit, make an appointment with your caregiver to find out the  results. Do not assume everything is normal if you have not heard from your caregiver or the medical facility. It is important for you to follow up on all of your test results.  SEEK IMMEDIATE MEDICAL ATTENTION IF: You have more than a spotting of blood in your stool.  Your belly is swollen (abdominal distention).  You are nauseated or vomiting.  You have a temperature over 101.  You have abdominal pain or discomfort that is severe or gets worse throughout the day.    4 polyps removed from your colon today  Further recommendations to follow pending review of pathology report  At patient request, I called [name] at [phone] -  reviewed findings and recommendations.    Office visit with [name] in 6 weeks

## 2022-09-21 NOTE — Op Note (Signed)
[facility] Patient Name: [name] Procedure Date: 09/21/2022 9:36 AM MRN: [id number] Date of Birth: 1966/03/11 Attending MD: Gennette Pac , MD, [id number] CSN: [id number] Age: 57 Admit Type: Outpatient Procedure:                Colonoscopy Indications:              High risk colon cancer surveillance: Personal                            history of colonic polyps Providers:                Gennette Pac, MD, Sheran Fava,                            Pandora Leiter, Technician Referring MD:              Medicines:                Propofol per Anesthesia Complications:            No immediate complications. Estimated Blood Loss:     Estimated blood loss was minimal. Procedure:                Pre-Anesthesia Assessment:                           - Prior to the procedure, a History and Physical                            was performed, and patient medications and                            allergies were reviewed. The patient's tolerance of                            previous anesthesia was also reviewed. The risks                            and benefits of the procedure and the sedation                            options and risks were discussed with the patient.                            All questions were answered, and informed consent                            was obtained. Prior Anticoagulants: The patient has                            taken no anticoagulant or antiplatelet agents. ASA                            Grade Assessment: III - A patient with severe  systemic disease. After reviewing the risks and                            benefits, the patient was deemed in satisfactory                            condition to undergo the procedure.                           After obtaining informed consent, the colonoscope                            was passed under direct vision. Throughout the                            procedure,  the patient's blood pressure, pulse, and                            oxygen saturations were monitored continuously. The                            [id number]) scope was introduced through the                            anus and advanced to the the cecum, identified by                            appendiceal orifice and ileocecal valve. The                            colonoscopy was performed without difficulty. The                            patient tolerated the procedure well. The quality                            of the bowel preparation was adequate. The                            ileocecal valve, appendiceal orifice, and rectum                            were photographed. Scope In: 10:09:22 AM Scope Out: 10:25:38 AM Scope Withdrawal Time: 0 hours 11 minutes 47 seconds  Total Procedure Duration: 0 hours 16 minutes 16 seconds  Findings:      The perianal and digital rectal examinations were normal. redundant       colon requiring external abdominal pressure to reach the cecum.      Four sessile polyps were found in the recto-sigmoid colon and cecum. The       polyps were 4 to 5 mm in size. These polyps were removed with a cold       snare. Resection and retrieval were complete. Estimated blood loss was       minimal.      The exam was otherwise  without abnormality on direct and retroflexion       views. Impression:               - Four 4 to 5 mm polyps at the recto-sigmoid colon                            and in the cecum, removed with a cold snare.                            Resected and retrieved.                           - The examination was otherwise normal on direct                            and retroflexion views. Moderate Sedation:      Moderate (conscious) sedation was personally administered by an       anesthesia professional. The following parameters were monitored: oxygen       saturation, heart rate, blood pressure, respiratory rate, EKG, adequacy       of  pulmonary ventilation, and response to care. Recommendation:           - Patient has a contact number available for                            emergencies. The signs and symptoms of potential                            delayed complications were discussed with the                            patient. Return to normal activities tomorrow.                            Written discharge instructions were provided to the                            patient.                           - Resume previous diet.                           - Continue present medications.                           - Repeat colonoscopy date to be determined after                            pending pathology results are reviewed for                            surveillance.                           - Return to GI office in 6 weeks. Procedure Code(s):        ---  Professional ---                           [phone], Colonoscopy, flexible; with removal of                            tumor(s), polyp(s), or other lesion(s) by snare                            technique Diagnosis Code(s):        --- Professional ---                           Z86.010, Personal history of colonic polyps                           D12.7, Benign neoplasm of rectosigmoid junction                           D12.0, Benign neoplasm of cecum CPT copyright 2022 [employer]. All rights reserved. The codes documented in this report are preliminary and upon coder review may  be revised to meet current compliance requirements. [name]. Sheila Hora, MD Gennette Pac, MD 09/21/2022 10:33:37 AM This report has been signed electronically. Number of Addenda: 0

## 2022-09-21 NOTE — Anesthesia Preprocedure Evaluation (Signed)
Anesthesia Evaluation  Patient identified by MRN, date of birth, ID band Patient awake    Reviewed: Allergy & Precautions, H&P , NPO status , Patient's Chart, lab work & pertinent test results  Airway Mallampati: II  TM Distance: >3 FB Neck ROM: Full    Dental  (+) Edentulous Upper, Edentulous Lower   Pulmonary neg pulmonary ROS, former smoker   Pulmonary exam normal breath sounds clear to auscultation       Cardiovascular negative cardio ROS Normal cardiovascular exam Rhythm:Regular Rate:Normal     Neuro/Psych Seizures -, Well Controlled,  PSYCHIATRIC DISORDERS  Depression Bipolar Disorder Schizophrenia   Neuromuscular disease    GI/Hepatic negative GI ROS, Neg liver ROS,,,  Endo/Other    Morbid obesity  Renal/GU negative Renal ROS  negative genitourinary   Musculoskeletal  (+) Arthritis , Osteoarthritis,    Abdominal   Peds negative pediatric ROS (+)  Hematology negative hematology ROS (+)   Anesthesia Other Findings   Reproductive/Obstetrics negative OB ROS                              Anesthesia Physical Anesthesia Plan  ASA: 3  Anesthesia Plan: General   Post-op Pain Management: Minimal or no pain anticipated   Induction: Intravenous  PONV Risk Score and Plan: 1 and TIVA  Airway Management Planned: Nasal Cannula and Natural Airway  Additional Equipment:   Intra-op Plan:   Post-operative Plan:   Informed Consent: I have reviewed the patients History and Physical, chart, labs and discussed the procedure including the risks, benefits and alternatives for the proposed anesthesia with the patient or authorized representative who has indicated his/her understanding and acceptance.     Dental advisory given  Plan Discussed with: CRNA and Surgeon  Anesthesia Plan Comments:          Anesthesia Quick Evaluation

## 2022-09-21 NOTE — Transfer of Care (Signed)
Immediate Anesthesia Transfer of Care Note  Patient: [name]  Procedure(s) Performed: COLONOSCOPY WITH PROPOFOL POLYPECTOMY  Patient Location: Short Stay  Anesthesia Type:General  Level of Consciousness: awake, alert , and oriented  Airway & Oxygen Therapy: Patient Spontanous Breathing  Post-op Assessment: Report given to RN, Post -op Vital signs reviewed and stable, Patient moving all extremities X 4, and Patient able to stick tongue midline  Post vital signs: Reviewed  Last Vitals:  Vitals Value Taken Time  BP 112/48 09/21/22 1031  Temp 36.7 C 09/21/22 1031  Pulse 72 09/21/22 1031  Resp 14 09/21/22 1031  SpO2 97 % 09/21/22 1031    Last Pain:  Vitals:   09/21/22 1031  TempSrc: Axillary  PainSc: 0-No pain         Complications: No notable events documented.

## 2022-09-23 LAB — SURGICAL PATHOLOGY

## 2022-09-25 ENCOUNTER — Encounter: Payer: Self-pay | Admitting: Internal Medicine

## 2022-09-26 ENCOUNTER — Encounter (HOSPITAL_COMMUNITY): Payer: Self-pay | Admitting: Internal Medicine

## 2022-10-12 ENCOUNTER — Emergency Department (HOSPITAL_COMMUNITY): Payer: Medicaid Other

## 2022-10-12 ENCOUNTER — Encounter (HOSPITAL_COMMUNITY): Payer: Self-pay

## 2022-10-12 ENCOUNTER — Emergency Department (HOSPITAL_COMMUNITY)
Admission: EM | Admit: 2022-10-12 | Discharge: 2022-10-13 | Disposition: A | Discharge status: Home / Self Care | Payer: Medicaid Other | Attending: Emergency Medicine | Admitting: Emergency Medicine

## 2022-10-12 DIAGNOSIS — Y93E5 Activity, floor mopping and cleaning: Secondary | ICD-10-CM | POA: Insufficient documentation

## 2022-10-12 DIAGNOSIS — M542 Cervicalgia: Secondary | ICD-10-CM | POA: Diagnosis not present

## 2022-10-12 DIAGNOSIS — S60412A Abrasion of right middle finger, initial encounter: Secondary | ICD-10-CM | POA: Insufficient documentation

## 2022-10-12 DIAGNOSIS — Z23 Encounter for immunization: Secondary | ICD-10-CM | POA: Insufficient documentation

## 2022-10-12 DIAGNOSIS — S0990XA Unspecified injury of head, initial encounter: Secondary | ICD-10-CM | POA: Diagnosis not present

## 2022-10-12 DIAGNOSIS — W19XXXA Unspecified fall, initial encounter: Secondary | ICD-10-CM

## 2022-10-12 DIAGNOSIS — S60414A Abrasion of right ring finger, initial encounter: Secondary | ICD-10-CM | POA: Insufficient documentation

## 2022-10-12 DIAGNOSIS — T148XXA Other injury of unspecified body region, initial encounter: Secondary | ICD-10-CM

## 2022-10-12 DIAGNOSIS — R519 Headache, unspecified: Secondary | ICD-10-CM | POA: Diagnosis present

## 2022-10-12 DIAGNOSIS — W01198A Fall on same level from slipping, tripping and stumbling with subsequent striking against other object, initial encounter: Secondary | ICD-10-CM | POA: Diagnosis not present

## 2022-10-12 MED ORDER — ACETAMINOPHEN 500 MG PO TABS
1000.0000 mg | ORAL_TABLET | Freq: Once | ORAL | Status: AC
  Administered 2022-10-12: 1000 mg via ORAL
  Filled 2022-10-12: qty 2

## 2022-10-12 MED ORDER — TETANUS-DIPHTH-ACELL PERTUSSIS 5-2.5-18.5 LF-MCG/0.5 IM SUSY
0.5000 mL | PREFILLED_SYRINGE | Freq: Once | INTRAMUSCULAR | Status: AC
  Administered 2022-10-12: 0.5 mL via INTRAMUSCULAR
  Filled 2022-10-12: qty 0.5

## 2022-10-12 NOTE — ED Provider Notes (Signed)
[place] EMERGENCY DEPARTMENT AT [facility] Provider Note   CSN: [id number] Arrival date & time: 10/12/22  2030     History {Add pertinent medical, surgical, social history, OB history to HPI:1} Chief Complaint  Patient presents with   [name] is a 57 y.o. female.  HPI     Home Medications Prior to Admission medications   Medication Sig Start Date End Date Taking? Authorizing Provider  acetaminophen (TYLENOL) 650 MG CR tablet Take 650 mg by mouth every 8 (eight) hours as needed for pain.    [provider]  benztropine (COGENTIN) 1 MG tablet Take 1 mg by mouth 2 (two) times daily. (0800 & 2000)    [provider]  bismuth subsalicylate (STOMACH RELIEF) 262 MG/15ML suspension Take 15 mLs by mouth 2 (two) times daily as needed for diarrhea or loose stools.    [provider]  busPIRone (BUSPAR) 15 MG tablet Take 15 mg by mouth 2 (two) times daily. (0800 & 1600)    [provider]  cholecalciferol (VITAMIN D3) 25 MCG (1000 UNIT) tablet Take 1,000 Units by mouth in the morning. (0800)    [provider]  divalproex (DEPAKOTE ER) 500 MG 24 hr tablet Take 500 mg by mouth in the morning and at bedtime. (0800 & 2000)    [provider]  famotidine (PEPCID) 40 MG tablet TAKE (1) TABLET BY MOUTH NIGHTLY. 08/24/22   Mahon, Frederik Schmidt, NP  fluPHENAZine (PROLIXIN) 10 MG tablet Take 10 mg by mouth 3 (three) times daily. (0800, 1400 & 2000) 08/19/19   [provider]  gabapentin (NEURONTIN) 100 MG capsule Take 100 mg by mouth 2 (two) times daily.    [provider]  Glycerin-Hypromellose-PEG 400 (ARTIFICIAL TEARS) 0.2-0.2-1 % SOLN Place 1 drop into both eyes in the morning, at noon, and at bedtime.    [provider]  levocetirizine (XYZAL) 5 MG tablet Take 5 mg by mouth in the morning. (0800) 05/31/22   [provider]  linaclotide Karlene Einstein) 145 MCG CAPS capsule Take 1 capsule  (145 mcg total) by mouth daily before breakfast. 08/16/22   Aida Raider, NP  loperamide (IMODIUM) 2 MG capsule Take 2 mg by mouth as needed for diarrhea or loose stools. Do not exceed 8mg /24 hours    [provider]  Melatonin 10 MG TABS Take 10 mg by mouth at bedtime. (1900)    [provider]  Menthol, Topical Analgesic, (BIOFREEZE) 4 % GEL Apply 1 mL topically 2 (two) times daily as needed (pain). Apply to back    [provider]  naproxen sodium (ALEVE) 220 MG tablet Take 220 mg by mouth every 8 (eight) hours as needed (for pain).    [provider]  nystatin cream (MYCOSTATIN) Apply 1 Application topically 3 (three) times daily as needed (rash, itching, or burning). Apply to vagina/groin    [provider]  OLANZapine (ZYPREXA) 20 MG tablet Take 20 mg by mouth in the morning. (0800)    [provider]  pantoprazole sodium (PROTONIX) 40 mg Take 40 mg by mouth in the morning. (0800)    [provider]  Pramox-PE-Glycerin-Petrolatum (HEMORRHOIDAL EX) Place 1 Application rectally daily. (0800)    [provider]  sertraline (ZOLOFT) 100 MG tablet Take 150 mg by mouth every morning. (0800)    [provider]  traZODone (DESYREL) 50 MG tablet Take 50 mg by mouth at bedtime.    [provider]  Vitamins A & D (VITAMIN A & D) ointment Apply 1 Application topically as needed for dry skin.    [provider]      Allergies    Aspirin and Nabumetone    Review of Systems   Review of Systems  Physical Exam Updated Vital Signs BP (!) 141/70   Pulse 62   Temp 97.9 F (36.6 C) (Oral)   Resp 17   Ht 5\' 2"  (1.575 m)   Wt 108.9 kg   SpO2 97%   BMI 43.90 kg/m  Physical Exam  ED Results / Procedures / Treatments   Labs (all labs ordered are listed, but only abnormal results are displayed) Labs Reviewed - No data to display  EKG None  Radiology CT Head Wo Contrast  Result Date:  10/12/2022 CLINICAL DATA:  head trauma, scalp laceration EXAM: CT HEAD WITHOUT CONTRAST TECHNIQUE: Contiguous axial images were obtained from the base of the skull through the vertex without intravenous contrast. RADIATION DOSE REDUCTION: This exam was performed according to the departmental dose-optimization program which includes automated exposure control, adjustment of the mA and/or kV according to patient size and/or use of iterative reconstruction technique. COMPARISON:  None Available. FINDINGS: Brain: Normal anatomic configuration. No abnormal intra or extra-axial mass lesion or fluid collection. No abnormal mass effect or midline shift. No evidence of acute intracranial hemorrhage or infarct. Ventricular size is normal. Cerebellum unremarkable. Vascular: Unremarkable Skull: Intact Sinuses/Orbits: Paranasal sinuses are clear. Orbits are unremarkable. Other: Mastoid air cells and middle ear cavities are clear. IMPRESSION: 1. No acute intracranial abnormality. No calvarial fracture. Electronically Signed   By: Helyn Numbers M.D.   On: 10/12/2022 22:13   CT Cervical Spine Wo Contrast  Result Date: 10/12/2022 CLINICAL DATA:  Fall, neck pain. EXAM: CT CERVICAL SPINE WITHOUT CONTRAST TECHNIQUE: Multidetector CT imaging of the cervical spine was performed without intravenous contrast. Multiplanar CT image reconstructions were also generated. RADIATION DOSE REDUCTION: This exam was performed according to the departmental dose-optimization program which includes automated exposure control, adjustment of the mA and/or kV according to patient size and/or use of iterative reconstruction technique. COMPARISON:  None Available. FINDINGS: Alignment: Normal. Skull base and vertebrae: No acute fracture. No primary bone lesion or focal pathologic process. Soft tissues and spinal canal: No prevertebral fluid or swelling. No visible canal hematoma. Disc levels: Disc spaces are preserved. Mild degenerative endplate changes  are seen in the lower cervical region. There is no central canal or neural foraminal stenosis identified. Upper chest: Negative. Other: None. IMPRESSION: No acute fracture or subluxation of the cervical spine. Electronically Signed   By: Darliss Cheney M.D.   On: 10/12/2022 22:12    Procedures Procedures  {Document cardiac monitor, telemetry assessment procedure when appropriate:1}  Medications Ordered in ED Medications  acetaminophen (TYLENOL) tablet 1,000 mg (has no administration in time range)    ED Course/ Medical Decision Making/ A&P   {   Click here for ABCD2, HEART and other calculatorsREFRESH Note before signing :1}                          Medical Decision Making Amount and/or Complexity of Data Reviewed Radiology: ordered.  Risk OTC drugs.   ***  {Document critical care time when appropriate:1} {Document review of labs and clinical decision tools ie heart score, Chads2Vasc2 etc:1}  {Document your independent review of radiology images, and any outside records:1} {Document your discussion with family members, caretakers,  and with consultants:1} {Document social determinants of health affecting pt's care:1} {Document your decision making why or why not admission, treatments were needed:1} Final Clinical Impression(s) / ED Diagnoses Final diagnoses:  None    Rx / DC Orders ED Discharge Orders     None

## 2022-10-12 NOTE — ED Triage Notes (Addendum)
Pt comes from [facility] after an unwitnessed fall, pt has contusion to back of L head, c/o neck pain no LOC, pt has laceration to R ring finger and middle finger, bleeding controlled

## 2022-10-12 NOTE — ED Provider Notes (Incomplete)
[place] EMERGENCY DEPARTMENT AT [facility] Provider Note   CSN: [id number] Arrival date & time: 10/12/22  2030     History {Add pertinent medical, surgical, social history, OB history to HPI:1} Chief Complaint  Patient presents with  . Fall    DAICY BEUS is a 57 y.o. female.  57 year old female with a history of seizures who presented to the emergency department after a fall.  Patient reports she was cleaning a bird bath when she fell and struck her head on the birdbath and has been having a headache and neck pain since then.  Also scratched her fingers on her right hand.  No loss of consciousness.  No vomiting afterwards.  Not on blood thinners.  No preceding symptoms.  Unsure of her last tetanus shot.  EMS noted a small hematoma to the occiput.       Home Medications Prior to Admission medications   Medication Sig Start Date End Date Taking? Authorizing Provider  acetaminophen (TYLENOL) 650 MG CR tablet Take 650 mg by mouth every 8 (eight) hours as needed for pain.    [provider]  benztropine (COGENTIN) 1 MG tablet Take 1 mg by mouth 2 (two) times daily. (0800 & 2000)    [provider]  bismuth subsalicylate (STOMACH RELIEF) 262 MG/15ML suspension Take 15 mLs by mouth 2 (two) times daily as needed for diarrhea or loose stools.    [provider]  busPIRone (BUSPAR) 15 MG tablet Take 15 mg by mouth 2 (two) times daily. (0800 & 1600)    [provider]  cholecalciferol (VITAMIN D3) 25 MCG (1000 UNIT) tablet Take 1,000 Units by mouth in the morning. (0800)    [provider]  divalproex (DEPAKOTE ER) 500 MG 24 hr tablet Take 500 mg by mouth in the morning and at bedtime. (0800 & 2000)    [provider]  famotidine (PEPCID) 40 MG tablet TAKE (1) TABLET BY MOUTH NIGHTLY. 08/24/22   Mahon, Frederik Schmidt, NP  fluPHENAZine (PROLIXIN) 10 MG tablet Take 10 mg by mouth 3 (three) times daily. (0800, 1400 & 2000)  08/19/19   [provider]  gabapentin (NEURONTIN) 100 MG capsule Take 100 mg by mouth 2 (two) times daily.    [provider]  Glycerin-Hypromellose-PEG 400 (ARTIFICIAL TEARS) 0.2-0.2-1 % SOLN Place 1 drop into both eyes in the morning, at noon, and at bedtime.    [provider]  levocetirizine (XYZAL) 5 MG tablet Take 5 mg by mouth in the morning. (0800) 05/31/22   [provider]  linaclotide Karlene Einstein) 145 MCG CAPS capsule Take 1 capsule (145 mcg total) by mouth daily before breakfast. 08/16/22   Aida Raider, NP  loperamide (IMODIUM) 2 MG capsule Take 2 mg by mouth as needed for diarrhea or loose stools. Do not exceed 8mg /24 hours    [provider]  Melatonin 10 MG TABS Take 10 mg by mouth at bedtime. (1900)    [provider]  Menthol, Topical Analgesic, (BIOFREEZE) 4 % GEL Apply 1 mL topically 2 (two) times daily as needed (pain). Apply to back    [provider]  naproxen sodium (ALEVE) 220 MG tablet Take 220 mg by mouth every 8 (eight) hours as needed (for pain).    [provider]  nystatin cream (MYCOSTATIN) Apply 1 Application topically 3 (three) times daily as needed (rash, itching, or burning). Apply to vagina/groin    [provider]  OLANZapine (ZYPREXA) 20 MG  tablet Take 20 mg by mouth in the morning. (0800)    [provider]  pantoprazole sodium (PROTONIX) 40 mg Take 40 mg by mouth in the morning. (0800)    [provider]  Pramox-PE-Glycerin-Petrolatum (HEMORRHOIDAL EX) Place 1 Application rectally daily. (0800)    [provider]  sertraline (ZOLOFT) 100 MG tablet Take 150 mg by mouth every morning. (0800)    [provider]  traZODone (DESYREL) 50 MG tablet Take 50 mg by mouth at bedtime.    [provider]  Vitamins A & D (VITAMIN A & D) ointment Apply 1 Application topically as needed for dry skin.    [provider]      Allergies     Aspirin and Nabumetone    Review of Systems   Review of Systems  Physical Exam Updated Vital Signs BP (!) 141/70   Pulse 62   Temp 97.9 F (36.6 C) (Oral)   Resp 17   Ht 5\' 2"  (1.575 m)   Wt 108.9 kg   SpO2 97%   BMI 43.90 kg/m  Physical Exam Vitals and nursing note reviewed.  Constitutional:      General: She is not in acute distress.    Appearance: She is well-developed.  HENT:     Head: Normocephalic and atraumatic.     Right Ear: External ear normal.     Left Ear: External ear normal.     Nose: Nose normal.  Eyes:     Extraocular Movements: Extraocular movements intact.     Conjunctiva/sclera: Conjunctivae normal.     Pupils: Pupils are equal, round, and reactive to light.  Neck:     Comments: C-collar in place Pulmonary:     Effort: Pulmonary effort is normal. No respiratory distress.  Abdominal:     General: Abdomen is flat. There is no distension.     Palpations: Abdomen is soft. There is no mass.     Tenderness: There is no abdominal tenderness. There is no guarding.  Musculoskeletal:     Right lower leg: No edema.     Left lower leg: No edema.     Comments: Small abrasions to right middle finger and ring finger.  Full range of motion of the fingers on the right side without any obvious deformities.  Skin:    General: Skin is warm and dry.  Neurological:     Mental Status: She is alert and oriented to person, place, and time. Mental status is at baseline.  Psychiatric:        Mood and Affect: Mood normal.     ED Results / Procedures / Treatments   Labs (all labs ordered are listed, but only abnormal results are displayed) Labs Reviewed - No data to display  EKG None  Radiology CT Head Wo Contrast  Result Date: 10/12/2022 CLINICAL DATA:  head trauma, scalp laceration EXAM: CT HEAD WITHOUT CONTRAST TECHNIQUE: Contiguous axial images were obtained from the base of the skull through the vertex without intravenous contrast. RADIATION DOSE REDUCTION:  This exam was performed according to the departmental dose-optimization program which includes automated exposure control, adjustment of the mA and/or kV according to patient size and/or use of iterative reconstruction technique. COMPARISON:  None Available. FINDINGS: Brain: Normal anatomic configuration. No abnormal intra or extra-axial mass lesion or fluid collection. No abnormal mass effect or midline shift. No evidence of acute intracranial hemorrhage or infarct. Ventricular size is normal. Cerebellum unremarkable. Vascular: Unremarkable Skull: Intact Sinuses/Orbits: Paranasal sinuses are  clear. Orbits are unremarkable. Other: Mastoid air cells and middle ear cavities are clear. IMPRESSION: 1. No acute intracranial abnormality. No calvarial fracture. Electronically Signed   By: Helyn Numbers M.D.   On: 10/12/2022 22:13   CT Cervical Spine Wo Contrast  Result Date: 10/12/2022 CLINICAL DATA:  Fall, neck pain. EXAM: CT CERVICAL SPINE WITHOUT CONTRAST TECHNIQUE: Multidetector CT imaging of the cervical spine was performed without intravenous contrast. Multiplanar CT image reconstructions were also generated. RADIATION DOSE REDUCTION: This exam was performed according to the departmental dose-optimization program which includes automated exposure control, adjustment of the mA and/or kV according to patient size and/or use of iterative reconstruction technique. COMPARISON:  None Available. FINDINGS: Alignment: Normal. Skull base and vertebrae: No acute fracture. No primary bone lesion or focal pathologic process. Soft tissues and spinal canal: No prevertebral fluid or swelling. No visible canal hematoma. Disc levels: Disc spaces are preserved. Mild degenerative endplate changes are seen in the lower cervical region. There is no central canal or neural foraminal stenosis identified. Upper chest: Negative. Other: None. IMPRESSION: No acute fracture or subluxation of the cervical spine. Electronically Signed   By:  Darliss Cheney M.D.   On: 10/12/2022 22:12    Procedures Procedures  {Document cardiac monitor, telemetry assessment procedure when appropriate:1}  Medications Ordered in ED Medications  acetaminophen (TYLENOL) tablet 1,000 mg (has no administration in time range)    ED Course/ Medical Decision Making/ A&P   {   Click here for ABCD2, HEART and other calculatorsREFRESH Note before signing :1}                          Medical Decision Making Amount and/or Complexity of Data Reviewed Radiology: ordered.  Risk OTC drugs. Prescription drug management.   WELTHA HORAK is a 57 y.o. female with comorbidities that complicate the patient evaluation with a history of seizures who presents emergency department with reported head trauma and neck pain after a fall  Initial Ddx:  TBI, c-spine injury, mechanical fall, syncope, hand fracture  MDM:  Concerned about TBI given the patient's fall so will obtain a head CT at this time.  With their neck pain we will also obtain a CT of the C-spine as well.  Appears that this was a result of mechanical fall and did not have any preceding symptoms that would suggest syncope.  No other signs of trauma at this time.  Will obtain x-rays of the hand to evaluate for fracture.  Will need to update the patient's tetanus shot as well since I do not see one on file and it has likely been over 10 years since she was received.  Plan:  CT head CT C-spine  Hand x-ray Tylenol Tdap update  ED Summary/Re-evaluation:  No acute injuries were found on the patient's imaging.  Will have them follow-up with thier primary doctor in several days regarding their symptoms.  This patient presents to the ED for concern of complaints listed in HPI, this involves an extensive number of treatment options, and is a complaint that carries with it a high risk of complications and morbidity. Disposition including potential need for admission considered.   Dispo: DC Home.  Return precautions discussed including, but not limited to, those listed in the AVS. Allowed pt time to ask questions which were answered fully prior to dc.  Records reviewed Outpatient Clinic Notes I independently reviewed the following imaging with scope of interpretation limited to determining acute  life threatening conditions related to emergency care: CT Head and agree with the radiologist interpretation with the following exceptions: none I have reviewed the patients home medications and made adjustments as needed  {Document critical care time when appropriate:1} {Document review of labs and clinical decision tools ie heart score, Chads2Vasc2 etc:1}  {Document your independent review of radiology images, and any outside records:1} {Document your discussion with family members, caretakers, and with consultants:1} {Document social determinants of health affecting pt's care:1} {Document your decision making why or why not admission, treatments were needed:1} Final Clinical Impression(s) / ED Diagnoses Final diagnoses:  None    Rx / DC Orders ED Discharge Orders     None

## 2022-10-13 NOTE — ED Notes (Signed)
[facility] states staff will not be here till 7am to pick up pt

## 2022-10-13 NOTE — ED Notes (Signed)
Attempted to call [facility] back to pick up pt with no answer, called sister to pick up pt with no answer

## 2022-10-13 NOTE — Discharge Instructions (Signed)
You were seen for your head and hand injury in the emergency department.   At home, please take Tylenol and ibuprofen for pain.    Check your MyChart online for the results of any tests that had not resulted by the time you left the emergency department.   Follow-up with your primary doctor in 2-3 days regarding your visit.    Return immediately to the emergency department if you experience any of the following: Severe headache, vomiting, or any other concerning symptoms.    Thank you for visiting our Emergency Department. It was a pleasure taking care of you today.

## 2022-10-13 NOTE — ED Notes (Signed)
Spoke with [facility] who came to pick her up. Said they don't have anyone on [facility] for transport and that is why pt. Had to stay all night.

## 2022-10-31 ENCOUNTER — Encounter: Payer: Self-pay | Admitting: Gastroenterology

## 2022-10-31 ENCOUNTER — Ambulatory Visit: Payer: Medicaid Other | Admitting: Gastroenterology

## 2022-10-31 NOTE — Progress Notes (Deleted)
GI Office Note    Referring Provider: [name], MD Primary Care Physician:  [name], MD Primary Gastroenterologist: ***  Date:  10/31/2022  ID:  [name], DOB [birth year], MRN [id number]   Chief Complaint   No chief complaint on file.    History of Present Illness  [name] is a 57 y.o. female with a history of hemorrhoids s/p hemorrhoidectomy in 2006, depression, seizures, bipolar/schizoaffective disorder, fatty liver, GERD, and constipation presenting today for follow-up.  EGD May 2007: -couple 5mm gastric polyps (benign) -normal stomach -normal duodenum -Continue PPI daily   Colonoscopy May 2007: -tight anal sphincter -prep sub-optimal -anal papilla -internal hemorrhoids -4mm rectal polyp -4mm polyp at 25cm -Anusol nightly advised for 10 days -start miralax nightly -colace BID   Cologuard negative in 2020.   Labs in May 2023 with elevated lipase 88, hemoglobin 11.8, platelets 282, potassium 133, albumin 3.1, normal LFTs.    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  Density in the gallbladder likely representing small gallstones without ductal dilation.   Per PCP visit note 05/04/2022 patient reported gradual, persistent abdominal pain.  Also patient was referred to Dr. Marcha Solders in September 2023 and provided with FOBT and refused colonoscopy.  Per note patient actively working on weight management with diet, exercise, and hydration.   Last office visit 06/20/22.  Sister was present with patient.  Patient reported getting sharp pains in her lower abdomen on have on again off again diarrhea that will occur for about 3 weeks treated with time also with some incontinence during her sleep.  May be okay for 2 weeks cycle repeats again.  Reported no previous help with antidiarrheals.  No recent sick contacts.  Sister  reported patient used to be constantly constipated.  Patient had not had GERD for 3 years but reported 1 year of on and off diarrhea.  No melena or BRBPR.  Has had hemorrhoidectomy before.  Does admit to sitting, for long periods of time and having intermittent burning after bowel movements.  Reports occasional choking with medications needing extra water and some occasional burning in throat.  Occasional NSAID use.  Had epigastric pain after eating that is sharp and worse in the evenings.  She reported being on long-term antiacid however according to her medication list and all of these medications were recently discontinued.  Sister reports patient is a lazy chewer and does not chew her food well.  Advised to restart famotidine 40 mg nightly.  Labs including fecal elastase ordered.  Scheduled for EGD and colonoscopy.  Consider trial of Xifaxan if testing negative.   EGD 07/11/2022: -Mild Schatzki's ring s/p dilation -Medium size hiatal hernia -Normal duodenum   Colonoscopy 07/11/2022: -Inadequate prep of colon.  Formed to semiformed stool in the rectum and sigmoid.   Labs 08/02/2022: Normal CBC, CMP, TSH, and lipase.  TTG IgA negative.  Pancreatic elastase 76.  Last office visit 08/15/2022.  Diarrhea improved.  Having pain in her bottom from having to strain.  During all of her prior colonoscopy prep but did not have any bowel movements.  Normal bowel regimen due to diarrhea.  Sister reported a history of constipation with lower abdominal pain.  Reflux controlled with famotidine and pantoprazole.  Occasional nausea but no vomiting.  Good appetite.  Advised to get a KUB and start Linzess 145 mcg once daily.  If unable  to get Linzess we will start MiraLAX twice daily.  Scheduled for colonoscopy with extended prep.  Continue PPI famotidine.  KUB 08/16/22: -Increased stool throughout the colon and rectum  Colonoscopy 09/21/2022: -Four 4-5 mm polyps in the rectosigmoid colon and cecum -Path: Hyperplastic  polyps (rectosigmoid with prolapse changes) -Advised repeat colonoscopy in 10 years  Today: Constipation -   GERD, Nausea -    Current Outpatient Medications  Medication Sig Dispense Refill   acetaminophen (TYLENOL) 650 MG CR tablet Take 650 mg by mouth every 8 (eight) hours as needed for pain.     benztropine (COGENTIN) 1 MG tablet Take 1 mg by mouth 2 (two) times daily. (0800 & 2000)     bismuth subsalicylate (STOMACH RELIEF) 262 MG/15ML suspension Take 15 mLs by mouth 2 (two) times daily as needed for diarrhea or loose stools.     busPIRone (BUSPAR) 15 MG tablet Take 15 mg by mouth 2 (two) times daily. (0800 & 1600)     cholecalciferol (VITAMIN D3) 25 MCG (1000 UNIT) tablet Take 1,000 Units by mouth in the morning. (0800)     divalproex (DEPAKOTE ER) 500 MG 24 hr tablet Take 500 mg by mouth in the morning and at bedtime. (0800 & 2000)     famotidine (PEPCID) 40 MG tablet TAKE (1) TABLET BY MOUTH NIGHTLY. 30 tablet 0   fluPHENAZine (PROLIXIN) 10 MG tablet Take 10 mg by mouth 3 (three) times daily. (0800, 1400 & 2000)     gabapentin (NEURONTIN) 100 MG capsule Take 100 mg by mouth 2 (two) times daily.     Glycerin-Hypromellose-PEG 400 (ARTIFICIAL TEARS) 0.2-0.2-1 % SOLN Place 1 drop into both eyes in the morning, at noon, and at bedtime.     levocetirizine (XYZAL) 5 MG tablet Take 5 mg by mouth in the morning. (0800)     linaclotide (LINZESS) 145 MCG CAPS capsule Take 1 capsule (145 mcg total) by mouth daily before breakfast. 30 capsule 3   loperamide (IMODIUM) 2 MG capsule Take 2 mg by mouth as needed for diarrhea or loose stools. Do not exceed 8mg /24 hours     Melatonin 10 MG TABS Take 10 mg by mouth at bedtime. (1900)     Menthol, Topical Analgesic, (BIOFREEZE) 4 % GEL Apply 1 mL topically 2 (two) times daily as needed (pain). Apply to back     naproxen sodium (ALEVE) 220 MG tablet Take 220 mg by mouth every 8 (eight) hours as needed (for pain).     nystatin cream (MYCOSTATIN) Apply 1  Application topically 3 (three) times daily as needed (rash, itching, or burning). Apply to vagina/groin     OLANZapine (ZYPREXA) 20 MG tablet Take 20 mg by mouth in the morning. (0800)     pantoprazole sodium (PROTONIX) 40 mg Take 40 mg by mouth in the morning. (0800)     Pramox-PE-Glycerin-Petrolatum (HEMORRHOIDAL EX) Place 1 Application rectally daily. (0800)     sertraline (ZOLOFT) 100 MG tablet Take 150 mg by mouth every morning. (0800)     traZODone (DESYREL) 50 MG tablet Take 50 mg by mouth at bedtime.     Vitamins A & D (VITAMIN A & D) ointment Apply 1 Application topically as needed for dry skin.     No current facility-administered medications for this visit.    Past Medical History:  Diagnosis Date   Depression    Fatty liver    Schizoaffective disorder, bipolar type (HCC)    Seizures (HCC)     Past  Surgical History:  Procedure Laterality Date   COLONOSCOPY WITH PROPOFOL N/A 09/21/2022   Procedure: COLONOSCOPY WITH PROPOFOL;  Surgeon: Corbin Ade, MD;  Location: [facility];  Service: Endoscopy;  Laterality: N/A;  10:30 AM   ESOPHAGOGASTRODUODENOSCOPY (EGD) WITH PROPOFOL N/A 07/11/2022   Procedure: ESOPHAGOGASTRODUODENOSCOPY (EGD) WITH PROPOFOL;  Surgeon: Corbin Ade, MD;  Location: [facility];  Service: Endoscopy;  Laterality: N/A;   FLEXIBLE SIGMOIDOSCOPY N/A 07/11/2022   Procedure: FLEXIBLE SIGMOIDOSCOPY;  Surgeon: Corbin Ade, MD;  Location: [facility];  Service: Endoscopy;  Laterality: N/A;  12:45 pm   POLYPECTOMY  09/21/2022   Procedure: POLYPECTOMY;  Surgeon: Corbin Ade, MD;  Location: [facility];  Service: Endoscopy;;   TUBAL LIGATION      Family History  Problem Relation Age of Onset   Cancer Maternal Grandmother    Cancer Father        lung   COPD Father    Breast cancer Mother    Other Paternal Grandfather        died after blood transfusion   Cancer Maternal Grandfather        lung    Allergies as of 10/31/2022 - Review  Complete 10/12/2022  Allergen Reaction Noted   Aspirin     Nabumetone      Social History   Socioeconomic History   Marital status: Single    Spouse name: Not on file   Number of children: Not on file   Years of education: Not on file   Highest education level: Not on file  Occupational History   Not on file  Tobacco Use   Smoking status: Former    Types: Cigarettes   Smokeless tobacco: Never  Vaping Use   Vaping Use: Never used  Substance and Sexual Activity   Alcohol use: No   Drug use: No   Sexual activity: Not Currently    Birth control/protection: Surgical    Comment: tubal  Other Topics Concern   Not on file  Social History Narrative   Not on file   Social Determinants of Health   Financial Resource Strain: Not on file  Food Insecurity: Not on file  Transportation Needs: Not on file  Physical Activity: Not on file  Stress: Not on file  Social Connections: Not on file     Review of Systems   Gen: Denies fever, chills, anorexia. Denies fatigue, weakness, weight loss.  CV: Denies chest pain, palpitations, syncope, peripheral edema, and claudication. Resp: Denies dyspnea at rest, cough, wheezing, coughing up blood, and pleurisy. GI: See HPI Derm: Denies rash, itching, dry skin Psych: Denies depression, anxiety, memory loss, confusion. No homicidal or suicidal ideation.  Heme: Denies bruising, bleeding, and enlarged lymph nodes.   Physical Exam   There were no vitals taken for this visit.  General:   Alert and oriented. No distress noted. Pleasant and cooperative.  Head:  Normocephalic and atraumatic. Eyes:  Conjuctiva clear without scleral icterus. Mouth:  Oral mucosa pink and moist. Good dentition. No lesions. Lungs:  Clear to auscultation bilaterally. No wheezes, rales, or rhonchi. No distress.  Heart:  S1, S2 present without murmurs appreciated.  Abdomen:  +BS, soft, non-tender and non-distended. No rebound or guarding. No HSM or masses  noted. Rectal: *** Msk:  Symmetrical without gross deformities. Normal posture. Extremities:  Without edema. Neurologic:  Alert and  oriented x4 Psych:  Alert and cooperative. Normal mood and affect.   Assessment  [name]  is a 57 y.o. female with a history of hemorrhoids s/p hemorrhoidectomy in 2006, depression, seizures, bipolar/schizoaffective disorder, fatty liver, GERD, and constipation presenting today for follow-up.  Constipation, lower abdominal pain:   GERD:   History of colon polyps: Colonoscopy in April 2024 with 4 polyps removed, pathology revealed hyperplastic changes.  Due for repeat in 10 years.  She did need an extended prep.  PLAN   *** Linzess?    [name], MSN, FNP-BC, AGACNP-BC [facility]
